# Patient Record
Sex: Female | Born: 1980 | Race: Black or African American | Hispanic: No | Smoking: Former smoker
Health system: Southern US, Community
[De-identification: ages and names within clinical notes are randomized; demographics above are authoritative.]

## PROBLEM LIST (undated history)

## (undated) ENCOUNTER — Inpatient Hospital Stay (HOSPITAL_COMMUNITY): Payer: Self-pay

## (undated) DIAGNOSIS — Z3490 Encounter for supervision of normal pregnancy, unspecified, unspecified trimester: Secondary | ICD-10-CM

## (undated) DIAGNOSIS — Z789 Other specified health status: Secondary | ICD-10-CM

## (undated) HISTORY — PX: DILATION AND CURETTAGE OF UTERUS: SHX78

## (undated) HISTORY — DX: Encounter for supervision of normal pregnancy, unspecified, unspecified trimester: Z34.90

## (undated) HISTORY — DX: Other specified health status: Z78.9

---

## 2002-09-13 ENCOUNTER — Emergency Department (HOSPITAL_COMMUNITY): Admission: EM | Admit: 2002-09-13 | Discharge: 2002-09-14 | Payer: Self-pay | Admitting: *Deleted

## 2003-11-01 ENCOUNTER — Ambulatory Visit (HOSPITAL_COMMUNITY): Admission: RE | Admit: 2003-11-01 | Discharge: 2003-11-01 | Payer: Self-pay | Admitting: Obstetrics & Gynecology

## 2004-04-20 ENCOUNTER — Emergency Department (HOSPITAL_COMMUNITY): Admission: EM | Admit: 2004-04-20 | Discharge: 2004-04-20 | Payer: Self-pay | Admitting: Emergency Medicine

## 2005-10-23 ENCOUNTER — Emergency Department (HOSPITAL_COMMUNITY): Admission: EM | Admit: 2005-10-23 | Discharge: 2005-10-23 | Payer: Self-pay | Admitting: Emergency Medicine

## 2006-04-23 ENCOUNTER — Emergency Department (HOSPITAL_COMMUNITY): Admission: EM | Admit: 2006-04-23 | Discharge: 2006-04-23 | Payer: Self-pay | Admitting: Emergency Medicine

## 2006-10-06 ENCOUNTER — Emergency Department (HOSPITAL_COMMUNITY): Admission: EM | Admit: 2006-10-06 | Discharge: 2006-10-06 | Payer: Self-pay | Admitting: Emergency Medicine

## 2006-12-05 ENCOUNTER — Emergency Department (HOSPITAL_COMMUNITY): Admission: EM | Admit: 2006-12-05 | Discharge: 2006-12-05 | Payer: Self-pay | Admitting: Emergency Medicine

## 2009-06-08 ENCOUNTER — Emergency Department (HOSPITAL_COMMUNITY): Admission: EM | Admit: 2009-06-08 | Discharge: 2009-06-08 | Payer: Self-pay | Admitting: Emergency Medicine

## 2009-11-20 ENCOUNTER — Inpatient Hospital Stay (HOSPITAL_COMMUNITY): Admission: AD | Admit: 2009-11-20 | Discharge: 2009-11-20 | Payer: Self-pay | Admitting: Obstetrics and Gynecology

## 2009-12-03 ENCOUNTER — Inpatient Hospital Stay (HOSPITAL_COMMUNITY): Admission: AD | Admit: 2009-12-03 | Discharge: 2009-12-03 | Payer: Self-pay | Admitting: Family Medicine

## 2009-12-18 ENCOUNTER — Inpatient Hospital Stay (HOSPITAL_COMMUNITY): Admission: AD | Admit: 2009-12-18 | Discharge: 2009-12-21 | Payer: Self-pay | Admitting: Obstetrics & Gynecology

## 2009-12-18 ENCOUNTER — Encounter: Payer: Self-pay | Admitting: Obstetrics & Gynecology

## 2010-05-31 ENCOUNTER — Emergency Department (HOSPITAL_COMMUNITY): Admission: EM | Admit: 2010-05-31 | Discharge: 2010-05-31 | Payer: Self-pay | Admitting: Emergency Medicine

## 2010-06-03 ENCOUNTER — Emergency Department (HOSPITAL_COMMUNITY)
Admission: EM | Admit: 2010-06-03 | Discharge: 2010-06-03 | Payer: Self-pay | Source: Home / Self Care | Admitting: Emergency Medicine

## 2010-07-04 ENCOUNTER — Emergency Department (HOSPITAL_COMMUNITY)
Admission: EM | Admit: 2010-07-04 | Discharge: 2010-07-04 | Payer: Self-pay | Source: Home / Self Care | Admitting: Emergency Medicine

## 2010-08-12 ENCOUNTER — Encounter (HOSPITAL_COMMUNITY): Payer: Self-pay | Admitting: Radiology

## 2010-08-12 ENCOUNTER — Emergency Department (HOSPITAL_COMMUNITY)
Admission: EM | Admit: 2010-08-12 | Discharge: 2010-08-12 | Disposition: A | Discharge status: Home / Self Care | Payer: BC Managed Care – PPO | Attending: Emergency Medicine | Admitting: Emergency Medicine

## 2010-08-12 ENCOUNTER — Emergency Department (HOSPITAL_COMMUNITY): Payer: BC Managed Care – PPO

## 2010-08-12 DIAGNOSIS — R0789 Other chest pain: Secondary | ICD-10-CM | POA: Insufficient documentation

## 2010-08-12 DIAGNOSIS — R319 Hematuria, unspecified: Secondary | ICD-10-CM | POA: Insufficient documentation

## 2010-08-12 DIAGNOSIS — R109 Unspecified abdominal pain: Secondary | ICD-10-CM | POA: Insufficient documentation

## 2010-08-12 LAB — URINALYSIS, ROUTINE W REFLEX MICROSCOPIC
Bilirubin Urine: NEGATIVE
Ketones, ur: NEGATIVE mg/dL
Nitrite: NEGATIVE
Urobilinogen, UA: 0.2 mg/dL (ref 0.0–1.0)
pH: 6.5 (ref 5.0–8.0)

## 2010-08-12 LAB — COMPREHENSIVE METABOLIC PANEL
ALT: 17 U/L (ref 0–35)
Alkaline Phosphatase: 66 U/L (ref 39–117)
BUN: 6 mg/dL (ref 6–23)
CO2: 25 mEq/L (ref 19–32)
Chloride: 105 mEq/L (ref 96–112)
GFR calc non Af Amer: >60 mL/min (ref >60)
Glucose, Bld: 104 mg/dL — ABNORMAL HIGH (ref 70–99)
Potassium: 3.2 mEq/L — ABNORMAL LOW (ref 3.5–5.1)
Sodium: 139 mEq/L (ref 135–145)
Total Bilirubin: 0.4 mg/dL (ref 0.3–1.2)

## 2010-08-12 LAB — DIFFERENTIAL
Basophils Relative: 1 % (ref 0–1)
Eosinophils Absolute: 0.1 10*3/uL (ref 0.0–0.7)
Eosinophils Relative: 2 % (ref 0–5)
Monocytes Relative: 6 % (ref 3–12)
Neutrophils Relative %: 52 % (ref 43–77)

## 2010-08-12 LAB — CBC
MCH: 29.2 pg (ref 26.0–34.0)
MCV: 85.4 fL (ref 78.0–100.0)
Platelets: 352 10*3/uL (ref 150–400)
RBC: 4.24 MIL/uL (ref 3.87–5.11)
RDW: 13.2 % (ref 11.5–15.5)
WBC: 5.6 10*3/uL (ref 4.0–10.5)

## 2010-08-12 LAB — LIPASE, BLOOD: Lipase: 22 U/L (ref 11–59)

## 2010-08-12 LAB — URINE MICROSCOPIC-ADD ON

## 2010-08-12 LAB — POCT PREGNANCY, URINE: Preg Test, Ur: NEGATIVE

## 2010-08-13 LAB — URINE CULTURE: Colony Count: >=100000

## 2010-08-22 ENCOUNTER — Emergency Department (HOSPITAL_COMMUNITY)
Admission: EM | Admit: 2010-08-22 | Discharge: 2010-08-22 | Disposition: A | Discharge status: Home / Self Care | Payer: BC Managed Care – PPO | Attending: Emergency Medicine | Admitting: Emergency Medicine

## 2010-08-22 DIAGNOSIS — R1013 Epigastric pain: Secondary | ICD-10-CM | POA: Insufficient documentation

## 2010-08-22 DIAGNOSIS — F411 Generalized anxiety disorder: Secondary | ICD-10-CM | POA: Insufficient documentation

## 2010-08-22 DIAGNOSIS — R110 Nausea: Secondary | ICD-10-CM | POA: Insufficient documentation

## 2010-08-22 DIAGNOSIS — R634 Abnormal weight loss: Secondary | ICD-10-CM | POA: Insufficient documentation

## 2010-08-22 DIAGNOSIS — R079 Chest pain, unspecified: Secondary | ICD-10-CM | POA: Insufficient documentation

## 2010-08-22 LAB — BASIC METABOLIC PANEL
CO2: 25 mEq/L (ref 19–32)
Chloride: 107 mEq/L (ref 96–112)
Creatinine, Ser: 0.83 mg/dL (ref 0.4–1.2)
GFR calc Af Amer: >60 mL/min (ref >60)
Potassium: 4.1 mEq/L (ref 3.5–5.1)
Sodium: 138 mEq/L (ref 135–145)

## 2010-08-22 LAB — URINALYSIS, ROUTINE W REFLEX MICROSCOPIC
Leukocytes, UA: NEGATIVE
Specific Gravity, Urine: 1.01 (ref 1.005–1.030)
Urine Glucose, Fasting: NEGATIVE mg/dL
Urobilinogen, UA: 0.2 mg/dL (ref 0.0–1.0)

## 2010-08-22 LAB — POCT PREGNANCY, URINE: Preg Test, Ur: NEGATIVE

## 2010-08-22 LAB — URINE MICROSCOPIC-ADD ON

## 2010-09-03 ENCOUNTER — Emergency Department (HOSPITAL_COMMUNITY)
Admission: EM | Admit: 2010-09-03 | Discharge: 2010-09-03 | Disposition: A | Discharge status: Home / Self Care | Payer: BC Managed Care – PPO | Attending: Emergency Medicine | Admitting: Emergency Medicine

## 2010-09-03 ENCOUNTER — Emergency Department (HOSPITAL_COMMUNITY): Payer: BC Managed Care – PPO

## 2010-09-03 DIAGNOSIS — K429 Umbilical hernia without obstruction or gangrene: Secondary | ICD-10-CM | POA: Insufficient documentation

## 2010-09-03 DIAGNOSIS — R1013 Epigastric pain: Secondary | ICD-10-CM | POA: Insufficient documentation

## 2010-09-03 LAB — CBC
HCT: 35.6 % — ABNORMAL LOW (ref 36.0–46.0)
Hemoglobin: 12 g/dL (ref 12.0–15.0)
MCH: 29 pg (ref 26.0–34.0)
MCHC: 33.7 g/dL (ref 30.0–36.0)
MCV: 86 fL (ref 78.0–100.0)

## 2010-09-03 LAB — COMPREHENSIVE METABOLIC PANEL
ALT: 14 U/L (ref 0–35)
BUN: 9 mg/dL (ref 6–23)
CO2: 26 mEq/L (ref 19–32)
Calcium: 9.2 mg/dL (ref 8.4–10.5)
Creatinine, Ser: 0.86 mg/dL (ref 0.4–1.2)
GFR calc non Af Amer: >60 mL/min (ref >60)
Glucose, Bld: 96 mg/dL (ref 70–99)

## 2010-09-03 LAB — LIPASE, BLOOD: Lipase: 25 U/L (ref 11–59)

## 2010-09-03 LAB — DIFFERENTIAL
Lymphocytes Relative: 26 % (ref 12–46)
Monocytes Absolute: 0.3 10*3/uL (ref 0.1–1.0)
Monocytes Relative: 6 % (ref 3–12)
Neutro Abs: 3.3 10*3/uL (ref 1.7–7.7)

## 2010-09-03 LAB — URINALYSIS, ROUTINE W REFLEX MICROSCOPIC
Bilirubin Urine: NEGATIVE
Hgb urine dipstick: NEGATIVE
Ketones, ur: NEGATIVE mg/dL
Specific Gravity, Urine: 1.02 (ref 1.005–1.030)
pH: 6 (ref 5.0–8.0)

## 2010-09-11 ENCOUNTER — Emergency Department (HOSPITAL_COMMUNITY)
Admission: EM | Admit: 2010-09-11 | Discharge: 2010-09-11 | Disposition: A | Discharge status: Home / Self Care | Payer: BC Managed Care – PPO | Attending: Emergency Medicine | Admitting: Emergency Medicine

## 2010-09-11 DIAGNOSIS — R0789 Other chest pain: Secondary | ICD-10-CM | POA: Insufficient documentation

## 2010-09-11 DIAGNOSIS — M79609 Pain in unspecified limb: Secondary | ICD-10-CM | POA: Insufficient documentation

## 2010-09-11 DIAGNOSIS — R51 Headache: Secondary | ICD-10-CM | POA: Insufficient documentation

## 2010-09-11 DIAGNOSIS — Z0000 Encounter for general adult medical examination without abnormal findings: Secondary | ICD-10-CM | POA: Insufficient documentation

## 2010-09-11 DIAGNOSIS — M542 Cervicalgia: Secondary | ICD-10-CM | POA: Insufficient documentation

## 2010-09-11 LAB — BASIC METABOLIC PANEL
CO2: 25 mEq/L (ref 19–32)
Chloride: 105 mEq/L (ref 96–112)
Creatinine, Ser: 0.92 mg/dL (ref 0.4–1.2)
GFR calc Af Amer: >60 mL/min (ref >60)
Sodium: 138 mEq/L (ref 135–145)

## 2010-09-11 LAB — CBC
HCT: 37.2 % (ref 36.0–46.0)
Platelets: 361 10*3/uL (ref 150–400)
RDW: 13.6 % (ref 11.5–15.5)
WBC: 6.2 10*3/uL (ref 4.0–10.5)

## 2010-09-11 LAB — DIFFERENTIAL
Basophils Absolute: 0 10*3/uL (ref 0.0–0.1)
Eosinophils Relative: 1 % (ref 0–5)
Lymphocytes Relative: 37 % (ref 12–46)
Neutro Abs: 3.5 10*3/uL (ref 1.7–7.7)

## 2010-09-11 LAB — POCT CARDIAC MARKERS
CKMB, poc: <1 ng/mL — ABNORMAL LOW (ref 1.0–8.0)
Myoglobin, poc: 31.6 ng/mL (ref 12–200)

## 2010-09-16 LAB — URINALYSIS, ROUTINE W REFLEX MICROSCOPIC
Glucose, UA: NEGATIVE mg/dL
Hgb urine dipstick: NEGATIVE
Specific Gravity, Urine: 1.015 (ref 1.005–1.030)
pH: 8 (ref 5.0–8.0)

## 2010-09-16 LAB — POCT PREGNANCY, URINE: Preg Test, Ur: NEGATIVE

## 2010-09-17 LAB — POCT CARDIAC MARKERS
CKMB, poc: <1 ng/mL — ABNORMAL LOW (ref 1.0–8.0)
Troponin i, poc: <0.05 ng/mL (ref 0.00–0.09)

## 2010-09-17 LAB — CBC
HCT: 40.4 % (ref 36.0–46.0)
MCV: 86 fL (ref 78.0–100.0)
RBC: 4.7 MIL/uL (ref 3.87–5.11)
WBC: 7.2 10*3/uL (ref 4.0–10.5)

## 2010-09-17 LAB — BASIC METABOLIC PANEL
Chloride: 110 mEq/L (ref 96–112)
GFR calc Af Amer: >60 mL/min (ref >60)
Potassium: 3.3 mEq/L — ABNORMAL LOW (ref 3.5–5.1)
Sodium: 139 mEq/L (ref 135–145)

## 2010-09-17 LAB — DIFFERENTIAL
Eosinophils Relative: 1 % (ref 0–5)
Lymphocytes Relative: 20 % (ref 12–46)
Lymphs Abs: 1.4 10*3/uL (ref 0.7–4.0)
Monocytes Absolute: 0.5 10*3/uL (ref 0.1–1.0)
Neutro Abs: 5.3 10*3/uL (ref 1.7–7.7)

## 2010-09-17 LAB — D-DIMER, QUANTITATIVE: D-Dimer, Quant: <0.22 ug/mL-FEU (ref 0.00–0.48)

## 2010-09-21 LAB — CBC
Hemoglobin: 12.7 g/dL (ref 12.0–15.0)
MCHC: 34.6 g/dL (ref 30.0–36.0)
MCV: 92.3 fL (ref 78.0–100.0)
Platelets: 191 10*3/uL (ref 150–400)
RBC: 4.09 MIL/uL (ref 3.87–5.11)
RDW: 14.4 % (ref 11.5–15.5)
RDW: 14.9 % (ref 11.5–15.5)
WBC: 6.3 10*3/uL (ref 4.0–10.5)

## 2010-09-21 LAB — RPR: RPR Ser Ql: NONREACTIVE

## 2010-09-21 LAB — TYPE AND SCREEN
ABO/RH(D): O POS
Antibody Screen: NEGATIVE

## 2010-09-22 LAB — URINALYSIS, ROUTINE W REFLEX MICROSCOPIC
Bilirubin Urine: NEGATIVE
Ketones, ur: NEGATIVE mg/dL
Nitrite: NEGATIVE
Protein, ur: NEGATIVE mg/dL
Urobilinogen, UA: 0.2 mg/dL (ref 0.0–1.0)
pH: 7 (ref 5.0–8.0)

## 2010-09-22 LAB — STREP B DNA PROBE: Strep Group B Ag: NEGATIVE

## 2010-09-22 LAB — GC/CHLAMYDIA PROBE AMP, GENITAL
Chlamydia, DNA Probe: NEGATIVE
GC Probe Amp, Genital: NEGATIVE

## 2010-10-07 LAB — URINE CULTURE

## 2010-10-07 LAB — URINALYSIS, ROUTINE W REFLEX MICROSCOPIC
Glucose, UA: NEGATIVE mg/dL
Leukocytes, UA: NEGATIVE
Protein, ur: NEGATIVE mg/dL
Specific Gravity, Urine: 1.025 (ref 1.005–1.030)

## 2010-10-07 LAB — URINE MICROSCOPIC-ADD ON

## 2010-11-21 NOTE — Op Note (Signed)
Kelli Kennedy, Kelli Kennedy                       ACCOUNT NO.:  [id number]   MEDICAL RECORD NO.:  [id number]                   PATIENT TYPE:  AMB   LOCATION:  DAY                                  FACILITY:  [facility]   PHYSICIAN:  Lazaro Arms, M.D.                DATE OF BIRTH:  1980-07-26   DATE OF PROCEDURE:  11/01/2003  DATE OF DISCHARGE:                                 OPERATIVE REPORT   PREOPERATIVE DIAGNOSIS:  Missed abortion at 10 weeks.   POSTOPERATIVE DIAGNOSIS:  Missed abortion at 10 weeks.   PROCEDURE:  Cervical dilation with a suction and sharp uterine curettage.   SURGEON:  Lazaro Arms, M.D.   ANESTHESIA:  Laryngeal mask airway.   FINDINGS:  The patient comes to the office two days prior and had an embryo  that was not growing for the past three weeks and no fetal cardiac activity.  As a result, she has a missed AB at 10 weeks.   DESCRIPTION OF PROCEDURE:  The patient was taken to the operating room and  placed in the supine position.  She underwent laryngeal mask airway and  placed in the dorsal lithotomy position.  She was prepped and draped in the  usual sterile fashion.   A Graves speculum was placed.  Her cervix was grasped.  A paracervical block  using 0.5% Marcaine with 1:200,000 epinephrine was placed.  The cervix was  dilated serially to allow passage of a 9 French curved curette.  Three  passes were made.  Gentle sharp curettage was performed.  All tissue was  removed.   The patient tolerated the procedure well.  She experienced minimal blood  loss and was taken to the recovery room in good and stable condition.  All  counts were correct.      ___________________________________________                                            Lazaro Arms, M.D.   LHE/MEDQ  D:  11/01/2003  T:  11/01/2003  Job:  [id number]

## 2011-04-23 LAB — URINALYSIS, ROUTINE W REFLEX MICROSCOPIC
Glucose, UA: NEGATIVE
Leukocytes, UA: NEGATIVE
Protein, ur: 30 — AB
Specific Gravity, Urine: >1.03 — ABNORMAL HIGH
Urobilinogen, UA: 1

## 2011-04-23 LAB — URINE MICROSCOPIC-ADD ON

## 2011-11-25 ENCOUNTER — Encounter (HOSPITAL_COMMUNITY): Payer: Self-pay | Admitting: *Deleted

## 2011-11-25 ENCOUNTER — Emergency Department (HOSPITAL_COMMUNITY)
Admission: EM | Admit: 2011-11-25 | Discharge: 2011-11-26 | Disposition: A | Discharge status: Home / Self Care | Payer: Self-pay | Attending: Emergency Medicine | Admitting: Emergency Medicine

## 2011-11-25 DIAGNOSIS — J309 Allergic rhinitis, unspecified: Secondary | ICD-10-CM | POA: Insufficient documentation

## 2011-11-25 DIAGNOSIS — J302 Other seasonal allergic rhinitis: Secondary | ICD-10-CM

## 2011-11-25 MED ORDER — FLUTICASONE PROPIONATE 50 MCG/ACT NA SUSP: 2.0000 | Freq: Every day | NASAL | Status: DC

## 2011-11-25 NOTE — ED Provider Notes (Signed)
History  This chart was scribed for [employer]. Colon Branch, MD by Bennett Scrape. This patient was seen in room [facility] and the patient's care was started at 11:10PM.  CSN: [id number]  Arrival date & time 11/25/11  2238   First MD Initiated Contact with Patient 11/25/11 2310      Chief Complaint  Patient presents with  . Nasal Congestion    The history is provided by the patient. No language interpreter was used.    Kelli Kennedy is a 31 y.o. female who presents to the Emergency Department complaining of 2 weeks of allergy symptoms including sore throat, bilateral temporal HA, congestion and eye irritation. Pt reports onset of sneezing today. She denies any modifying factors. She reports taking zyrtec and benadryl with mild improvement in her symptoms. She reports prior episodes of similar symptoms but states that this year her allergies have been more severe than normal. She denies SOB, cough and wheezing as associated symptoms. She has no h/o chronic medical conditions. She is a former smoker but denies alcohol use. Pt works as a Midwife for [employer].  No PCP.  History reviewed. No pertinent past medical history.  Past Surgical History  Procedure Date  . Cesarean section   . Dilation and curettage of uterus     History reviewed. No pertinent family history.  History  Substance Use Topics  . Smoking status: Former Games developer  . Smokeless tobacco: Not on file  . Alcohol Use: No     Review of Systems  Constitutional: Negative for fever.       10 Systems reviewed and are negative for acute change except as noted in the HPI.  HENT: Positive for sore throat, rhinorrhea, sneezing and postnasal drip. Negative for congestion.   Eyes: Negative for discharge and redness.  Respiratory: Negative for cough and shortness of breath.   Cardiovascular: Negative for chest pain.  Gastrointestinal: Negative for vomiting and abdominal pain.  Musculoskeletal: Negative for back pain.    Skin: Negative for rash.  Neurological: Negative for syncope, numbness and headaches.  Psychiatric/Behavioral:       No behavior change.      Allergies  Review of patient's allergies indicates no known allergies.  Home Medications  No current outpatient prescriptions on file.  Triage Vitals: BP 103/68  Pulse 85  Temp(Src) 98.5 F (36.9 C) (Oral)  Resp 20  Ht 5\' 2"  (1.575 m)  Wt 145 lb (65.772 kg)  BMI 26.52 kg/m2  SpO2 99%  Physical Exam  Nursing note and vitals reviewed. Constitutional: She is oriented to person, place, and time. She appears well-developed and well-nourished. No distress.  HENT:  Head: Normocephalic and atraumatic.       Nasal passages are swollen and erythematous   Eyes:       Allergic conjunctivitis bilaterally that is worse in right eye than the left  Neck: Normal range of motion. Neck supple. No tracheal deviation present.       Shoddy anterior cervical adenopathy bilaterally  Cardiovascular: Normal rate and regular rhythm.  Exam reveals no gallop and no friction rub.   No murmur heard. Pulmonary/Chest: Effort normal and breath sounds normal. No respiratory distress.  Abdominal: Soft. She exhibits no distension.  Musculoskeletal: Normal range of motion. She exhibits no edema.  Neurological: She is alert and oriented to person, place, and time.  Skin: Skin is warm and dry.  Psychiatric: She has a normal mood and affect. Her behavior is normal.    ED  Course  Procedures (including critical care time)  DIAGNOSTIC STUDIES: Oxygen Saturation is 99% on room air, normal by my interpretation.    COORDINATION OF CARE: 11:28PM-Discussed discharge plan of Flonase prescription and advised pt to pick one antihistamine to take for her symptoms. Pt agreed to plan.      MDM  Patient with a history of seasonal allergies who is here with runny nose, allergic conjunctivitis, scratchy throat and hasn't responded to Zyrtec or Benadryl. Given prescription  for Flonase.Pt stable in ED with no significant deterioration in condition.The patient appears reasonably screened and/or stabilized for discharge and I doubt any other medical condition or other [facility] requiring further screening, evaluation, or treatment in the ED at this time prior to discharge.  I personally performed the services described in this documentation, which was scribed in my presence. The recorded information has been reviewed and considered.   MDM Reviewed: nursing note and vitals          Nicoletta Dress. Colon Branch, MD 11/25/11 2349

## 2011-11-25 NOTE — ED Notes (Signed)
Pt reports allergy symptoms for two weeks. States nothing she is taking is working.

## 2011-11-25 NOTE — Discharge Instructions (Signed)
Continue to take your zyrtec. Use the Flonase as directed.    Allergies, Generic Allergies may happen from anything your body is sensitive to. This may be food, medicines, pollens, chemicals, and nearly anything around you in everyday life that produces allergens. An allergen is anything that causes an allergy producing substance. Heredity is often a factor in causing these problems. This means you may have some of the same allergies as your parents. Food allergies happen in all age groups. Food allergies are some of the most severe and life threatening. Some common food allergies are cow's milk, seafood, eggs, nuts, wheat, and soybeans. SYMPTOMS   Swelling around the mouth.   An itchy red rash or hives.   Vomiting or diarrhea.   Difficulty breathing.  SEVERE ALLERGIC REACTIONS ARE LIFE-THREATENING. This reaction is called anaphylaxis. It can cause the mouth and throat to swell and cause difficulty with breathing and swallowing. In severe reactions only a trace amount of food (for example, peanut oil in a salad) may cause death within seconds. Seasonal allergies occur in all age groups. These are seasonal because they usually occur during the same season every year. They may be a reaction to molds, grass pollens, or tree pollens. Other causes of problems are house dust mite allergens, pet dander, and mold spores. The symptoms often consist of nasal congestion, a runny itchy nose associated with sneezing, and tearing itchy eyes. There is often an associated itching of the mouth and ears. The problems happen when you come in contact with pollens and other allergens. Allergens are the particles in the air that the body reacts to with an allergic reaction. This causes you to release allergic antibodies. Through a chain of events, these eventually cause you to release histamine into the blood stream. Although it is meant to be protective to the body, it is this release that causes your discomfort. This  is why you were given anti-histamines to feel better. If you are unable to pinpoint the offending allergen, it may be determined by skin or blood testing. Allergies cannot be cured but can be controlled with medicine. Hay fever is a collection of all or some of the seasonal allergy problems. It may often be treated with simple over-the-counter medicine such as diphenhydramine. Take medicine as directed. Do not drink alcohol or drive while taking this medicine. Check with your caregiver or package insert for child dosages. If these medicines are not effective, there are many new medicines your caregiver can prescribe. Stronger medicine such as nasal spray, eye drops, and corticosteroids may be used if the first things you try do not work well. Other treatments such as immunotherapy or desensitizing injections can be used if all else fails. Follow up with your caregiver if problems continue. These seasonal allergies are usually not life threatening. They are generally more of a nuisance that can often be handled using medicine. HOME CARE INSTRUCTIONS   If unsure what causes a reaction, keep a diary of foods eaten and symptoms that follow. Avoid foods that cause reactions.   If hives or rash are present:   Take medicine as directed.   You may use an over-the-counter antihistamine (diphenhydramine) for hives and itching as needed.   Apply cold compresses (cloths) to the skin or take baths in cool water. Avoid hot baths or showers. Heat will make a rash and itching worse.   If you are severely allergic:   Following a treatment for a severe reaction, hospitalization is often required for closer  follow-up.   Wear a medic-alert bracelet or necklace stating the allergy.   You and your family must learn how to give adrenaline or use an anaphylaxis kit.   If you have had a severe reaction, always carry your anaphylaxis kit or EpiPen with you. Use this medicine as directed by your caregiver if a severe  reaction is occurring. Failure to do so could have a fatal outcome.  SEEK MEDICAL CARE IF:  You suspect a food allergy. Symptoms generally happen within 30 minutes of eating a food.   Your symptoms have not gone away within 2 days or are getting worse.   You develop new symptoms.   You want to retest yourself or your child with a food or drink you think causes an allergic reaction. Never do this if an anaphylactic reaction to that food or drink has happened before. Only do this under the care of a caregiver.  SEEK IMMEDIATE MEDICAL CARE IF:   You have difficulty breathing, are wheezing, or have a tight feeling in your chest or throat.   You have a swollen mouth, or you have hives, swelling, or itching all over your body.   You have had a severe reaction that has responded to your anaphylaxis kit or an EpiPen. These reactions may return when the medicine has worn off. These reactions should be considered life threatening.  MAKE SURE YOU:   Understand these instructions.   Will watch your condition.   Will get help right away if you are not doing well or get worse.  Document Released: 09/15/2002 Document Revised: 06/11/2011 Document Reviewed: 02/20/2008 [facility] Patient Information 2012 [place], [place].

## 2012-05-08 ENCOUNTER — Emergency Department (HOSPITAL_COMMUNITY)
Admission: EM | Admit: 2012-05-08 | Discharge: 2012-05-08 | Disposition: A | Discharge status: Home / Self Care | Payer: Self-pay | Attending: Emergency Medicine | Admitting: Emergency Medicine

## 2012-05-08 ENCOUNTER — Encounter (HOSPITAL_COMMUNITY): Payer: Self-pay | Admitting: *Deleted

## 2012-05-08 DIAGNOSIS — R300 Dysuria: Secondary | ICD-10-CM | POA: Insufficient documentation

## 2012-05-08 DIAGNOSIS — N390 Urinary tract infection, site not specified: Secondary | ICD-10-CM | POA: Insufficient documentation

## 2012-05-08 DIAGNOSIS — R350 Frequency of micturition: Secondary | ICD-10-CM | POA: Insufficient documentation

## 2012-05-08 DIAGNOSIS — M549 Dorsalgia, unspecified: Secondary | ICD-10-CM

## 2012-05-08 DIAGNOSIS — M545 Low back pain, unspecified: Secondary | ICD-10-CM | POA: Insufficient documentation

## 2012-05-08 DIAGNOSIS — Z9889 Other specified postprocedural states: Secondary | ICD-10-CM | POA: Insufficient documentation

## 2012-05-08 DIAGNOSIS — Z87891 Personal history of nicotine dependence: Secondary | ICD-10-CM | POA: Insufficient documentation

## 2012-05-08 LAB — URINALYSIS, ROUTINE W REFLEX MICROSCOPIC
Glucose, UA: NEGATIVE mg/dL
Ketones, ur: NEGATIVE mg/dL
Protein, ur: NEGATIVE mg/dL
Urobilinogen, UA: 0.2 mg/dL (ref 0.0–1.0)

## 2012-05-08 LAB — URINE MICROSCOPIC-ADD ON

## 2012-05-08 MED ORDER — NITROFURANTOIN MONOHYD MACRO 100 MG PO CAPS
100.0000 mg | ORAL_CAPSULE | Freq: Two times a day (BID) | ORAL | Status: DC
  Administered 2012-05-08: 100 mg via ORAL
  Filled 2012-05-08: qty 1

## 2012-05-08 MED ORDER — NITROFURANTOIN MONOHYD MACRO 100 MG PO CAPS: 100.0000 mg | ORAL_CAPSULE | Freq: Two times a day (BID) | ORAL | Status: DC

## 2012-05-08 MED ORDER — HYDROCODONE-ACETAMINOPHEN 5-325 MG PO TABS: ORAL_TABLET | ORAL | Status: DC

## 2012-05-08 MED ORDER — HYDROCODONE-ACETAMINOPHEN 5-325 MG PO TABS
1.0000 | ORAL_TABLET | Freq: Once | ORAL | Status: AC
  Administered 2012-05-08: 1 via ORAL
  Filled 2012-05-08: qty 1

## 2012-05-08 NOTE — ED Provider Notes (Signed)
History     CSN: [id number]  Arrival date & time 05/08/12  1416   None     Chief Complaint  Patient presents with  . Back Pain    (Consider location/radiation/quality/duration/timing/severity/associated sxs/prior treatment) Patient is a 31 y.o. female presenting with back pain. The history is provided by the patient.  Back Pain  This is a new problem. The current episode started more than 1 week ago. The problem occurs constantly. The problem has been gradually worsening. The pain is associated with no known injury. The pain is present in the lumbar spine. The quality of the pain is described as aching. The pain does not radiate. The pain is mild. The symptoms are aggravated by bending, twisting and certain positions. Associated symptoms include dysuria. Pertinent negatives include no chest pain, no fever, no numbness, no abdominal pain, no abdominal swelling, no bowel incontinence, no perianal numbness, no bladder incontinence, no pelvic pain, no leg pain, no paresthesias, no paresis, no tingling and no weakness. She has tried nothing for the symptoms. The treatment provided no relief.    History reviewed. No pertinent past medical history.  Past Surgical History  Procedure Date  . Cesarean section   . Dilation and curettage of uterus     No family history on file.  History  Substance Use Topics  . Smoking status: Former Games developer  . Smokeless tobacco: Not on file  . Alcohol Use: No    OB History    Grav Para Term Preterm Abortions TAB SAB Ect Mult Living                  Review of Systems  Constitutional: Negative for fever.  HENT: Negative for neck pain and neck stiffness.   Respiratory: Negative for shortness of breath.   Cardiovascular: Negative for chest pain.  Gastrointestinal: Negative for vomiting, abdominal pain, constipation and bowel incontinence.  Genitourinary: Positive for dysuria, urgency and frequency. Negative for bladder incontinence, hematuria, flank  pain, decreased urine volume, vaginal bleeding, vaginal discharge, difficulty urinating, vaginal pain and pelvic pain.       No perineal numbness or incontinence of urine or feces  Musculoskeletal: Positive for back pain. Negative for joint swelling.  Skin: Negative for rash.  Neurological: Negative for tingling, weakness, numbness and paresthesias.  All other systems reviewed and are negative.    Allergies  Review of patient's allergies indicates no known allergies.  Home Medications   Current Outpatient Rx  Name  Route  Sig  Dispense  Refill  . FLUTICASONE PROPIONATE 50 MCG/ACT NA SUSP   Nasal   Place 2 sprays into the nose daily.   16 g   2     BP 112/66  Pulse 81  Temp 98.1 F (36.7 C) (Oral)  Resp 20  Ht 5\' 2"  (1.575 m)  Wt 150 lb (68.04 kg)  BMI 27.44 kg/m2  SpO2 100%  Physical Exam  Nursing note and vitals reviewed. Constitutional: She is oriented to person, place, and time. She appears well-developed and well-nourished. No distress.  HENT:  Head: Normocephalic and atraumatic.  Neck: Normal range of motion. Neck supple.  Cardiovascular: Normal rate, regular rhythm and intact distal pulses.   No murmur heard. Pulmonary/Chest: Effort normal and breath sounds normal.  Abdominal: Soft. She exhibits no distension and no mass. There is no tenderness. There is no rebound and no guarding.       No suprapubic tenderness  Musculoskeletal: She exhibits tenderness. She exhibits no edema.  Lumbar back: She exhibits tenderness and pain. She exhibits normal range of motion, no swelling, no deformity, no laceration and normal pulse.       Back:       ttp of the right lumbar paraspinal muscles.  No CVA tenderness  Neurological: She is alert and oriented to person, place, and time. She has normal strength. No cranial nerve deficit or sensory deficit. She exhibits normal muscle tone. Coordination and gait normal.  Reflex Scores:      Patellar reflexes are 2+ on the right  side and 2+ on the left side.      Achilles reflexes are 2+ on the right side and 2+ on the left side. Skin: Skin is warm and dry.    ED Course  Procedures (including critical care time)  Results for orders placed during the hospital encounter of 05/08/12  URINALYSIS, ROUTINE W REFLEX MICROSCOPIC      Component Value Range   Color, Urine YELLOW  YELLOW   APPearance CLOUDY (*) CLEAR   Specific Gravity, Urine >1.030 (*) 1.005 - 1.030   pH 5.5  5.0 - 8.0   Glucose, UA NEGATIVE  NEGATIVE mg/dL   Hgb urine dipstick TRACE (*) NEGATIVE   Bilirubin Urine NEGATIVE  NEGATIVE   Ketones, ur NEGATIVE  NEGATIVE mg/dL   Protein, ur NEGATIVE  NEGATIVE mg/dL   Urobilinogen, UA 0.2  0.0 - 1.0 mg/dL   Nitrite POSITIVE (*) NEGATIVE   Leukocytes, UA NEGATIVE  NEGATIVE  URINE MICROSCOPIC-ADD ON      Component Value Range   Squamous Epithelial / LPF MANY (*) RARE   WBC, UA 3-6  <3 WBC/hpf   RBC / HPF 3-6  <3 RBC/hpf   Bacteria, UA MANY (*) RARE  PREGNANCY, URINE      Component Value Range   Preg Test, Ur NEGATIVE  NEGATIVE     Urine culture is pending    MDM     Pt reported having a sensation of incomplete emptying of her bladder.  Bladder scanned after voiding, with approx 25 to 30 cc of urine remaining.  Sx's likely related to bladder spasms secondary to bacteria.  No fever, vomiting, chills or CVA tenderness to suggest pyleo.  I will start abx and pain medication.  Pt agrees to return here if sx's are not improving     Raffi Milstein L. [place], [place] 05/09/12 2216

## 2012-05-08 NOTE — ED Notes (Signed)
C/o right lower back pain x 1 month; denies injury; reports feeling of incomplete emptying of bladder, and urinary frequency. Denies n/v/d, denies abd pain.

## 2012-05-10 NOTE — ED Provider Notes (Signed)
Medical screening examination/treatment/procedure(s) were performed by non-physician practitioner and as supervising physician I was immediately available for consultation/collaboration.   Glynn Octave, MD 05/10/12 [phone]

## 2012-05-11 LAB — URINE CULTURE: Colony Count: >=100000

## 2012-05-12 NOTE — ED Notes (Signed)
+   Urine] Patient treated with Macrobid-sensitive to same-chart appended per protocol MD. 

## 2012-08-10 LAB — HM PAP SMEAR: HM PAP: NEGATIVE

## 2012-11-10 IMAGING — CR DG ABDOMEN ACUTE W/ 1V CHEST
3 series · 3 of 3 positions shown · non-contrast
Comparison: Chest radiograph 06/03/2010

CLINICAL DATA: Constipation

ACUTE ABDOMEN SERIES (ABDOMEN 2 VIEW & CHEST 1 VIEW)

[w chest pa]
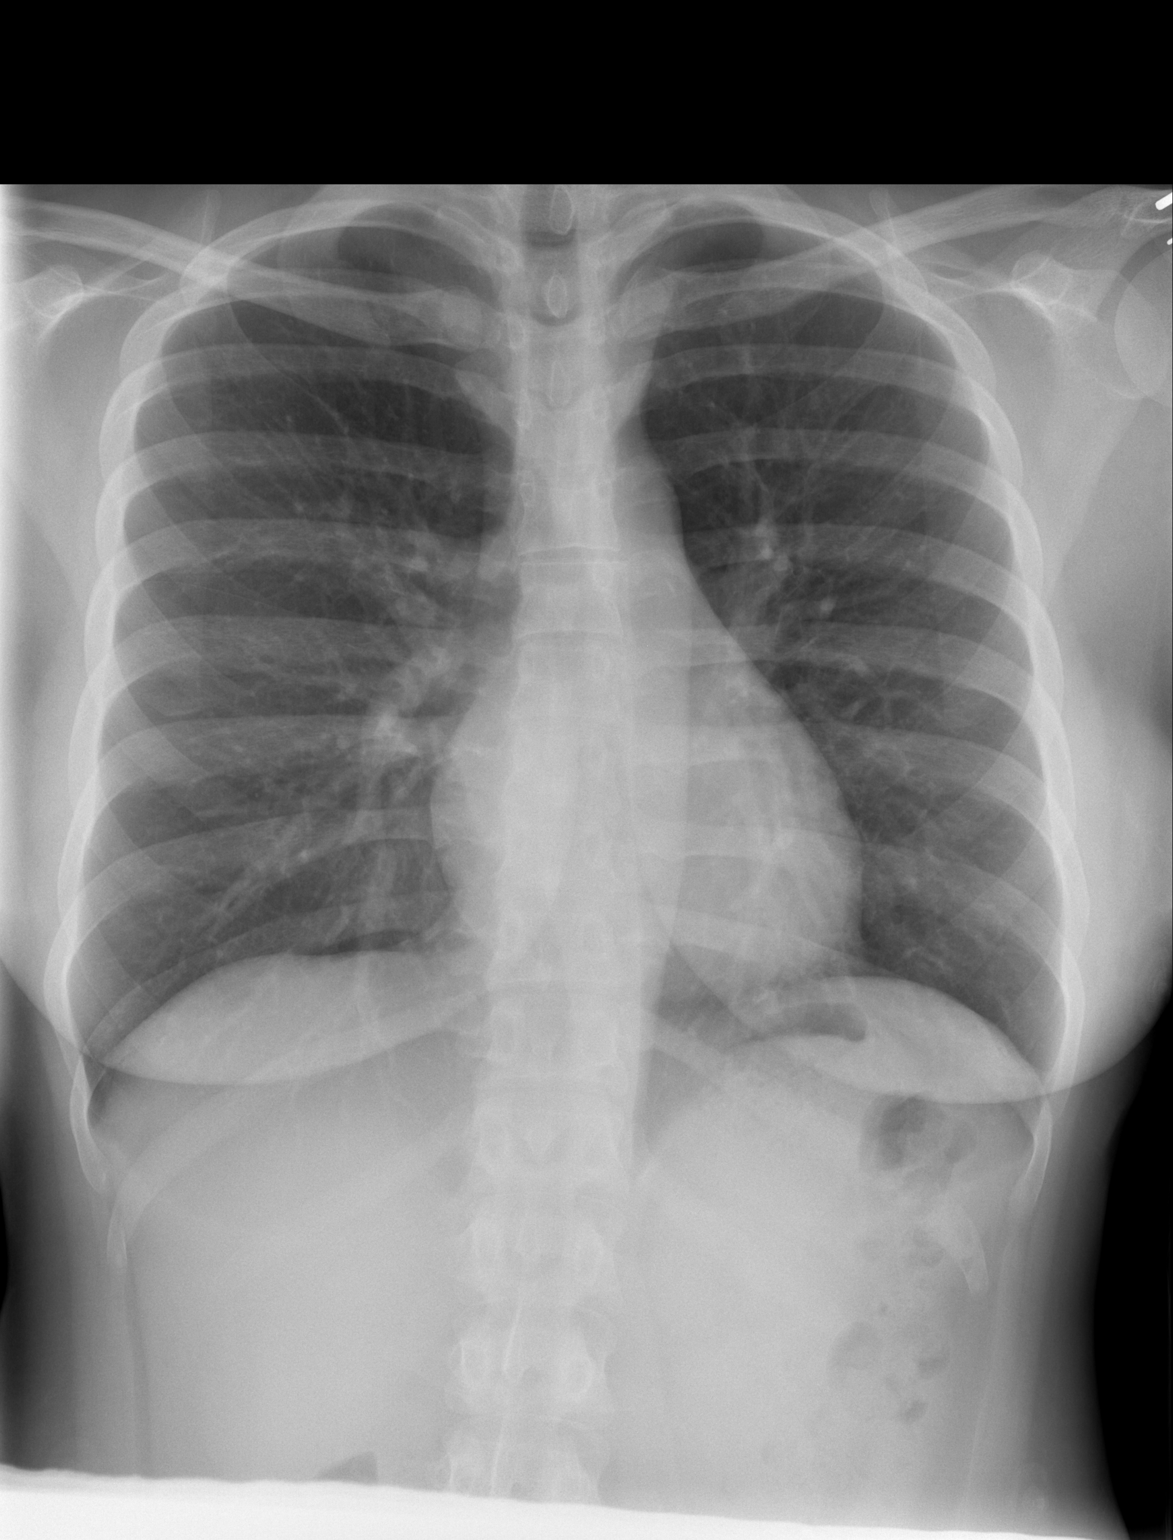

[w abdomen upright]
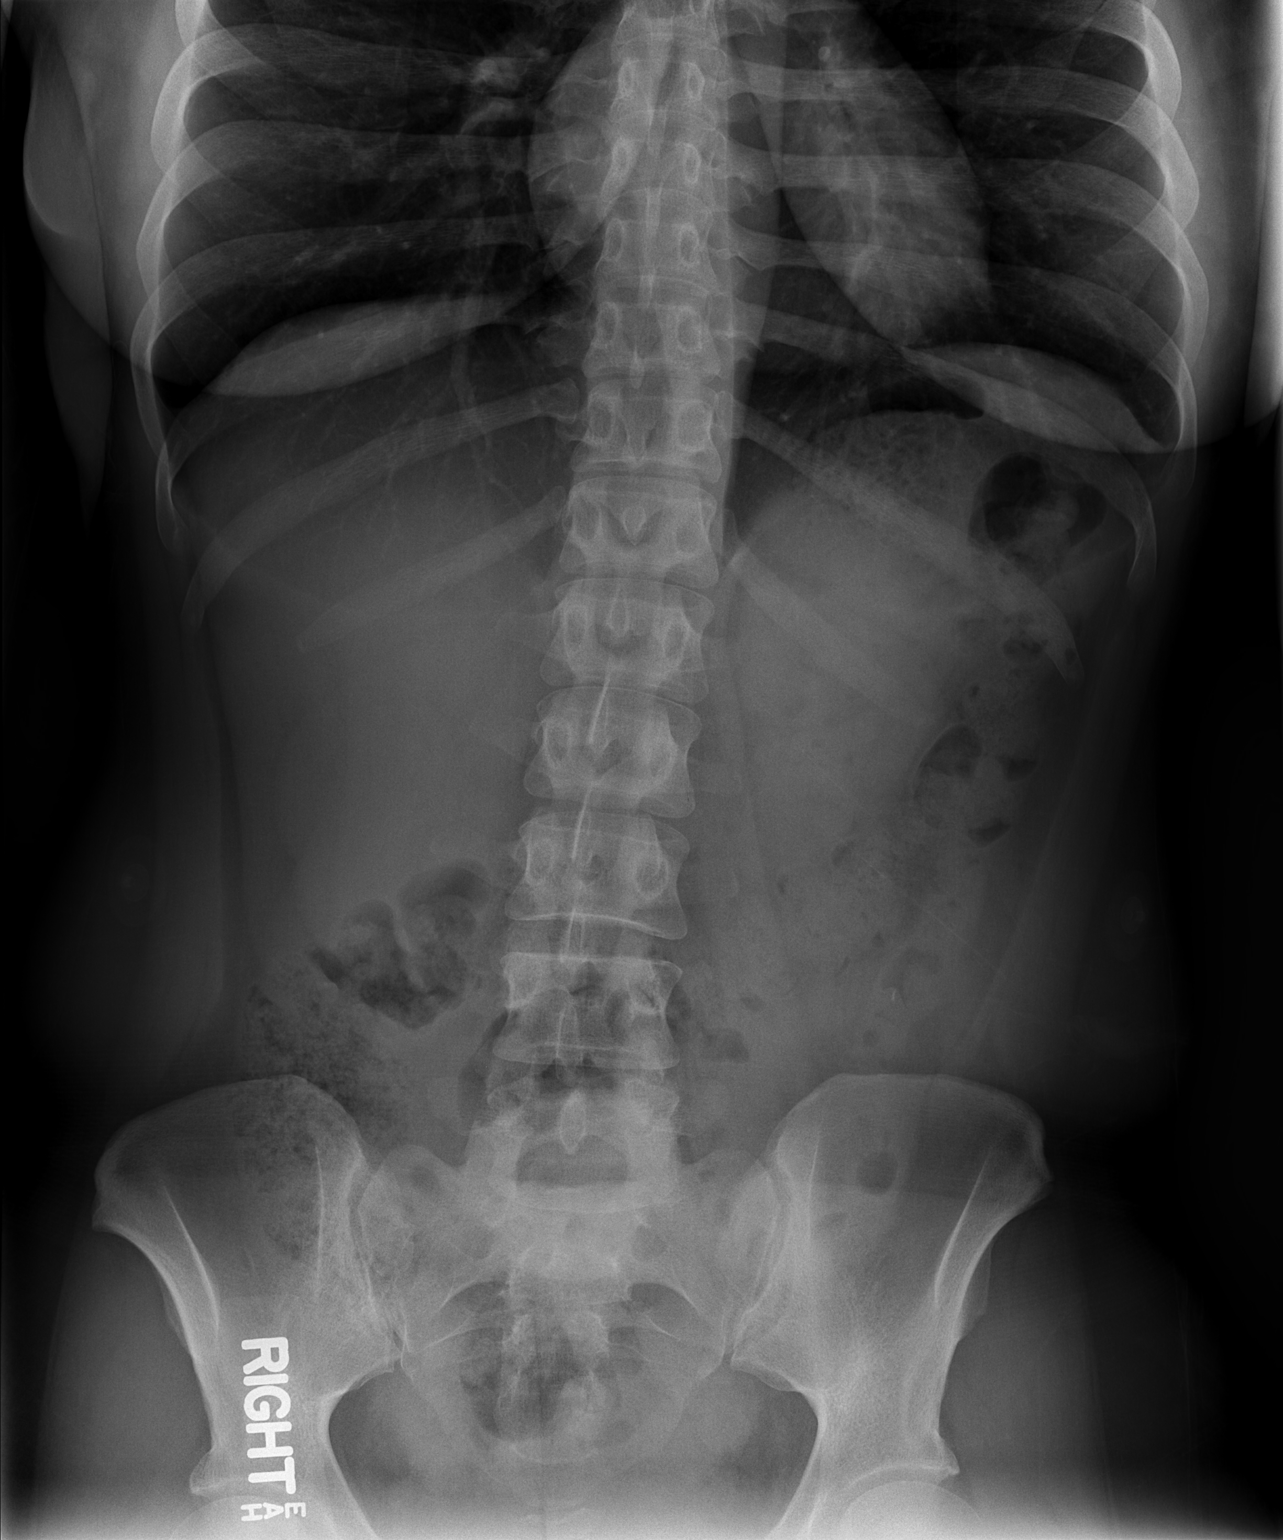

[t abdomen supine]
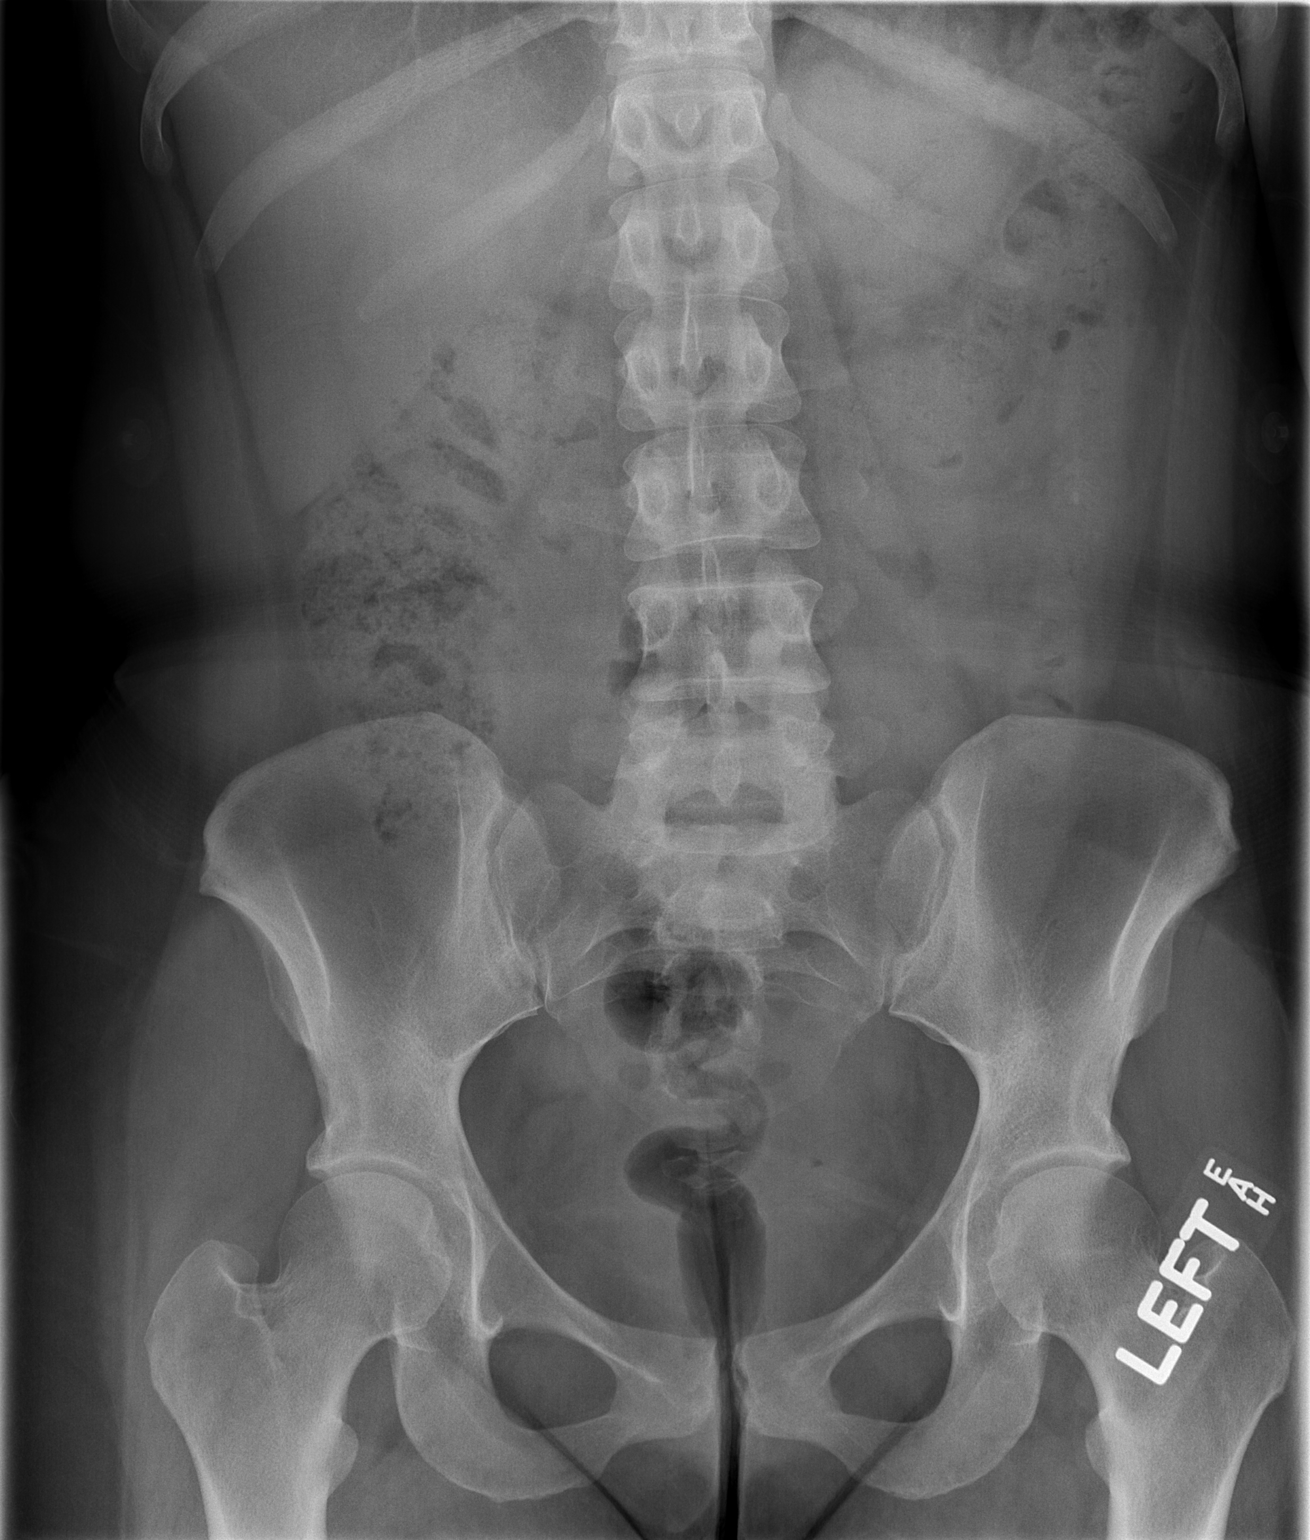

[3 of 3 positions shown; findings below may reference images not displayed]

FINDINGS: Normal heart size, mediastinal contours, and pulmonary vascularity.
Lungs clear.
Normal bowel gas pattern.
No bowel dilatation, bowel wall thickening or free intraperitoneal
air.
Osseous structures unremarkable.
No urinary tract calcification.
Normal stool burden in colon.
IMPRESSION: Normal exam.

## 2013-12-08 ENCOUNTER — Ambulatory Visit: Payer: 59 | Admitting: Obstetrics & Gynecology

## 2014-06-05 HISTORY — PX: INDUCED ABORTION: SHX677

## 2014-07-06 NOTE — L&D Delivery Note (Signed)
Delivery Note Patient presented with SOL, complicated only by GBS positive status which was adequately treated.  At 9:20 AM a viable female was delivered via VBAC, Spontaneous (Presentation: Left Occiput Anterior).  APGAR: 8, 9; weight 6 lb 7.4 oz (2931 g).  Placenta status: Intact, Spontaneous.  Cord: 3 vessels with the following complications: None.  Cord pH: None Collected   Anesthesia: Epidural  Episiotomy: None Lacerations: None Suture Repair: None Est. Blood Loss (mL): 125  Mom to postpartum.  Baby to Couplet care / Skin to Skin.  Kelli Kennedy 06/06/2015, 11:36 AM  OB fellow attestation: Patient is a [id number] at 391w0d who was admitted in SOL  I was gloved and present for delivery in its entirety. Variable decels during second stage noted.  Complications: none  Lacerations: None  EBL: 125  Federico FlakeKimberly Niles [place], MD 5:28 PM

## 2014-10-17 ENCOUNTER — Ambulatory Visit (INDEPENDENT_AMBULATORY_CARE_PROVIDER_SITE_OTHER): Payer: BLUE CROSS/BLUE SHIELD | Admitting: Adult Health

## 2014-10-17 ENCOUNTER — Encounter: Payer: Self-pay | Admitting: Adult Health

## 2014-10-17 VITALS — BP 110/60 | HR 92 | Ht 60.0 in | Wt 168.0 lb

## 2014-10-17 DIAGNOSIS — N926 Irregular menstruation, unspecified: Secondary | ICD-10-CM

## 2014-10-17 DIAGNOSIS — Z3201 Encounter for pregnancy test, result positive: Secondary | ICD-10-CM | POA: Diagnosis not present

## 2014-10-17 DIAGNOSIS — Z3490 Encounter for supervision of normal pregnancy, unspecified, unspecified trimester: Secondary | ICD-10-CM

## 2014-10-17 DIAGNOSIS — O3680X0 Pregnancy with inconclusive fetal viability, not applicable or unspecified: Secondary | ICD-10-CM

## 2014-10-17 HISTORY — DX: Encounter for supervision of normal pregnancy, unspecified, unspecified trimester: Z34.90

## 2014-10-17 LAB — POCT URINE PREGNANCY: Preg Test, Ur: POSITIVE

## 2014-10-17 MED ORDER — DOXYLAMINE-PYRIDOXINE 10-10 MG PO TBEC: DELAYED_RELEASE_TABLET | ORAL | Status: DC

## 2014-10-17 MED ORDER — PRENATAL PLUS 27-1 MG PO TABS: 1.0000 | ORAL_TABLET | Freq: Every day | ORAL | Status: AC

## 2014-10-17 NOTE — Patient Instructions (Addendum)
First Trimester of Pregnancy The first trimester of pregnancy is from week 1 until the end of week 12 (months 1 through 3). A week after a sperm fertilizes an egg, the egg will implant on the wall of the uterus. This embryo will begin to develop into a baby. Genes from you and your partner are forming the baby. The female genes determine whether the baby is a boy or a girl. At 6-8 weeks, the eyes and face are formed, and the heartbeat can be seen on ultrasound. At the end of 12 weeks, all the baby's organs are formed.  Now that you are pregnant, you will want to do everything you can to have a healthy baby. Two of the most important things are to get good prenatal care and to follow your health care provider's instructions. Prenatal care is all the medical care you receive before the baby's birth. This care will help prevent, find, and treat any problems during the pregnancy and childbirth. BODY CHANGES Your body goes through many changes during pregnancy. The changes vary from woman to woman.   You may gain or lose a couple of pounds at first.  You may feel sick to your stomach (nauseous) and throw up (vomit). If the vomiting is uncontrollable, call your health care provider.  You may tire easily.  You may develop headaches that can be relieved by medicines approved by your health care provider.  You may urinate more often. Painful urination may mean you have a bladder infection.  You may develop heartburn as a result of your pregnancy.  You may develop constipation because certain hormones are causing the muscles that push waste through your intestines to slow down.  You may develop hemorrhoids or swollen, bulging veins (varicose veins).  Your breasts may begin to grow larger and become tender. Your nipples may stick out more, and the tissue that surrounds them (areola) may become darker.  Your gums may bleed and may be sensitive to brushing and flossing.  Dark spots or blotches (chloasma,  mask of pregnancy) may develop on your face. This will likely fade after the baby is born.  Your menstrual periods will stop.  You may have a loss of appetite.  You may develop cravings for certain kinds of food.  You may have changes in your emotions from day to day, such as being excited to be pregnant or being concerned that something may go wrong with the pregnancy and baby.  You may have more vivid and strange dreams.  You may have changes in your hair. These can include thickening of your hair, rapid growth, and changes in texture. Some women also have hair loss during or after pregnancy, or hair that feels dry or thin. Your hair will most likely return to normal after your baby is born. WHAT TO EXPECT AT YOUR PRENATAL VISITS During a routine prenatal visit:  You will be weighed to make sure you and the baby are growing normally.  Your blood pressure will be taken.  Your abdomen will be measured to track your baby's growth.  The fetal heartbeat will be listened to starting around week 10 or 12 of your pregnancy.  Test results from any previous visits will be discussed. Your health care provider may ask you:  How you are feeling.  If you are feeling the baby move.  If you have had any abnormal symptoms, such as leaking fluid, bleeding, severe headaches, or abdominal cramping.  If you have any questions. Other tests   that may be performed during your first trimester include:  Blood tests to find your blood type and to check for the presence of any previous infections. They will also be used to check for low iron levels (anemia) and Rh antibodies. Later in the pregnancy, blood tests for diabetes will be done along with other tests if problems develop.  Urine tests to check for infections, diabetes, or protein in the urine.  An ultrasound to confirm the proper growth and development of the baby.  An amniocentesis to check for possible genetic problems.  Fetal screens for  spina bifida and Down syndrome.  You may need other tests to make sure you and the baby are doing well. HOME CARE INSTRUCTIONS  Medicines  Follow your health care provider's instructions regarding medicine use. Specific medicines may be either safe or unsafe to take during pregnancy.  Take your prenatal vitamins as directed.  If you develop constipation, try taking a stool softener if your health care provider approves. Diet  Eat regular, well-balanced meals. Choose a variety of foods, such as meat or vegetable-based protein, fish, milk and low-fat dairy products, vegetables, fruits, and whole grain breads and cereals. Your health care provider will help you determine the amount of weight gain that is right for you.  Avoid raw meat and uncooked cheese. These carry germs that can cause birth defects in the baby.  Eating four or five small meals rather than three large meals a day may help relieve nausea and vomiting. If you start to feel nauseous, eating a few soda crackers can be helpful. Drinking liquids between meals instead of during meals also seems to help nausea and vomiting.  If you develop constipation, eat more high-fiber foods, such as fresh vegetables or fruit and whole grains. Drink enough fluids to keep your urine clear or pale yellow. Activity and Exercise  Exercise only as directed by your health care provider. Exercising will help you:  Control your weight.  Stay in shape.  Be prepared for labor and delivery.  Experiencing pain or cramping in the lower abdomen or low back is a good sign that you should stop exercising. Check with your health care provider before continuing normal exercises.  Try to avoid standing for long periods of time. Move your legs often if you must stand in one place for a long time.  Avoid heavy lifting.  Wear low-heeled shoes, and practice good posture.  You may continue to have sex unless your health care provider directs you  otherwise. Relief of Pain or Discomfort  Wear a good support bra for breast tenderness.   Take warm sitz baths to soothe any pain or discomfort caused by hemorrhoids. Use hemorrhoid cream if your health care provider approves.   Rest with your legs elevated if you have leg cramps or low back pain.  If you develop varicose veins in your legs, wear support hose. Elevate your feet for 15 minutes, 3-4 times a day. Limit salt in your diet. Prenatal Care  Schedule your prenatal visits by the twelfth week of pregnancy. They are usually scheduled monthly at first, then more often in the last 2 months before delivery.  Write down your questions. Take them to your prenatal visits.  Keep all your prenatal visits as directed by your health care provider. Safety  Wear your seat belt at all times when driving.  Make a list of emergency phone numbers, including numbers for family, friends, the hospital, and police and fire departments. General Tips    Ask your health care provider for a referral to a local prenatal education class. Begin classes no later than at the beginning of month 6 of your pregnancy.  Ask for help if you have counseling or nutritional needs during pregnancy. Your health care provider can offer advice or refer you to specialists for help with various needs.  Do not use hot tubs, steam rooms, or saunas.  Do not douche or use tampons or scented sanitary pads.  Do not cross your legs for long periods of time.  Avoid cat litter boxes and soil used by cats. These carry germs that can cause birth defects in the baby and possibly loss of the fetus by miscarriage or stillbirth.  Avoid all smoking, herbs, alcohol, and medicines not prescribed by your health care provider. Chemicals in these affect the formation and growth of the baby.  Schedule a dentist appointment. At home, brush your teeth with a soft toothbrush and be gentle when you floss. SEEK MEDICAL CARE IF:   You have  dizziness.  You have mild pelvic cramps, pelvic pressure, or nagging pain in the abdominal area.  You have persistent nausea, vomiting, or diarrhea.  You have a bad smelling vaginal discharge.  You have pain with urination.  You notice increased swelling in your face, hands, legs, or ankles. SEEK IMMEDIATE MEDICAL CARE IF:   You have a fever.  You are leaking fluid from your vagina.  You have spotting or bleeding from your vagina.  You have severe abdominal cramping or pain.  You have rapid weight gain or loss.  You vomit blood or material that looks like coffee grounds.  You are exposed to [place] measles and have never had them.  You are exposed to fifth disease or chickenpox.  You develop a severe headache.  You have shortness of breath.  You have any kind of trauma, such as from a fall or a car accident. Document Released: 06/16/2001 Document Revised: 11/06/2013 Document Reviewed: 05/02/2013 [facility] Patient Information 2015 [employer] [place]. This information is not intended to replace advice given to you by your health care provider. Make sure you discuss any questions you have with your health care provider. Return in 1 week fior US  Try diclegis Eat often

## 2014-10-17 NOTE — Progress Notes (Signed)
Subjective:     Patient ID: Kelli RiasShameka L Kennedy, female   DOB: Apr 30, 1981, 34 y.o.   MRN: [id number]  HPI Kelli MunsonShameka is a 34 year old black female, married in for missed period and nausea, wants UPT.No bleeding.  Review of Systems +missed period +nausea, all other systems negative Reviewed past medical,surgical, social and family history. Reviewed medications and allergies.     Objective:   Physical Exam BP 110/60 mmHg  Pulse 92  Ht 5' (1.524 m)  Wt 168 lb (76.204 kg)  BMI 32.81 kg/m2  LMP 06/05/2014+UPT, periods irregular not sure of last one, US showed about 5-6 week fetal pole with +YS and FHR 117.Get medicaid, form given.Skin warm and dry. Neck: mid line trachea, normal thyroid, good ROM, no lymphadenopathy noted. Lungs: clear to ausculation bilaterally. Cardiovascular: regular rate and rhythm.Abdomen soft non tender.    Assessment:     Pregnant +UPT    Plan:     Rx prenatal plus #30 1 daily with 11 refills Try diclegis #36 given take as directed Return in 1 week for dating US  Eat often Review handout on first trimester

## 2014-10-21 ENCOUNTER — Encounter (HOSPITAL_COMMUNITY): Payer: Self-pay

## 2014-10-21 ENCOUNTER — Emergency Department (HOSPITAL_COMMUNITY)
Admission: EM | Admit: 2014-10-21 | Discharge: 2014-10-22 | Disposition: A | Discharge status: Home / Self Care | Payer: BLUE CROSS/BLUE SHIELD | Attending: Emergency Medicine | Admitting: Emergency Medicine

## 2014-10-21 DIAGNOSIS — Z3A01 Less than 8 weeks gestation of pregnancy: Secondary | ICD-10-CM | POA: Diagnosis not present

## 2014-10-21 DIAGNOSIS — R109 Unspecified abdominal pain: Secondary | ICD-10-CM

## 2014-10-21 DIAGNOSIS — Z87891 Personal history of nicotine dependence: Secondary | ICD-10-CM | POA: Diagnosis not present

## 2014-10-21 DIAGNOSIS — O9989 Other specified diseases and conditions complicating pregnancy, childbirth and the puerperium: Secondary | ICD-10-CM | POA: Diagnosis not present

## 2014-10-21 DIAGNOSIS — Z9889 Other specified postprocedural states: Secondary | ICD-10-CM | POA: Insufficient documentation

## 2014-10-21 DIAGNOSIS — Z3490 Encounter for supervision of normal pregnancy, unspecified, unspecified trimester: Secondary | ICD-10-CM

## 2014-10-21 DIAGNOSIS — Z79899 Other long term (current) drug therapy: Secondary | ICD-10-CM | POA: Diagnosis not present

## 2014-10-21 NOTE — ED Notes (Signed)
Abdominal cramping that started yesterday, no bleeding, pregnant at this time. Patient states that her LPM was in December but periods have always been irregular. OBGYN appointment scheduled on Thursday of this week.

## 2014-10-22 LAB — URINALYSIS, ROUTINE W REFLEX MICROSCOPIC
BILIRUBIN URINE: NEGATIVE
Glucose, UA: NEGATIVE mg/dL
Hgb urine dipstick: NEGATIVE
KETONES UR: NEGATIVE mg/dL
Leukocytes, UA: NEGATIVE
NITRITE: NEGATIVE
PH: 6.5 (ref 5.0–8.0)
Protein, ur: NEGATIVE mg/dL
Specific Gravity, Urine: 1.01 (ref 1.005–1.030)
Urobilinogen, UA: 0.2 mg/dL (ref 0.0–1.0)

## 2014-10-22 LAB — HCG, QUANTITATIVE, PREGNANCY: HCG, BETA CHAIN, QUANT, S: 61971 m[IU]/mL — AB (ref <5)

## 2014-10-22 NOTE — ED Provider Notes (Signed)
CSN: [id number]     Arrival date & time 10/21/14  2325 History   First MD Initiated Contact with Patient 10/21/14 2331     Chief Complaint  Patient presents with  . Abdominal Cramping     (Consider location/radiation/quality/duration/timing/severity/associated sxs/prior Treatment) HPI Comments: Patient is a 34 year old female with a history of 3 pregnancies and 2 deliveries. She states that at this time she has about [redacted] weeks pregnant. She presents to the emergency department due to a complaint of abdominal cramping.  The patient states that on yesterday she started noticing some cramping in the lower abdomen. Today the cramping was not as severe but still present. She became concerned and came to the emergency department for evaluation. There's been no vaginal bleeding, no high fever, no injury to the abdomen or pelvis. His been no vomiting. There has been some mild nausea. It is of note that the patient was seen by her GYN physician last week at which time the 5 week pregnancy was determined by ultrasound. Fetal heart beat was identified.  Patient is a 34 y.o. female presenting with cramps. The history is provided by the patient.  Abdominal Cramping This is a new problem. Associated symptoms include nausea.    Past Medical History  Diagnosis Date  . Pregnant 10/17/2014   Past Surgical History  Procedure Laterality Date  . Cesarean section    . Dilation and curettage of uterus    . Induced abortion  06/2014   No family history on file. History  Substance Use Topics  . Smoking status: Former Smoker    Types: Cigarettes  . Smokeless tobacco: Never Used  . Alcohol Use: No   OB History    Gravida Para Term Preterm AB TAB SAB Ectopic Multiple Living   Review of Systems  Gastrointestinal: Positive for nausea.  All other systems reviewed and are negative.     Allergies  Review of patient's allergies indicates no known allergies.  Home Medications   Prior  to Admission medications   Medication Sig Start Date End Date Taking? Authorizing Provider  Doxylamine-Pyridoxine (DICLEGIS) 10-10 MG TBEC Take as directed 10/17/14  Yes [name], NP  prenatal vitamin w/FE, FA (PRENATAL 1 + 1) 27-1 MG TABS tablet Take 1 tablet by mouth daily at 12 noon. 10/17/14  Yes [name], NP   BP 120/77 mmHg  Pulse 96  Temp(Src) 98.6 F (37 C) (Oral)  Resp 20  Ht 5' (1.524 m)  Wt 168 lb (76.204 kg)  BMI 32.81 kg/m2  SpO2 100%  LMP 06/05/2014 Physical Exam  Constitutional: She is oriented to person, place, and time. She appears well-developed and well-nourished.  Non-toxic appearance.  HENT:  Head: Normocephalic.  Right Ear: Tympanic membrane and external ear normal.  Left Ear: Tympanic membrane and external ear normal.  Eyes: EOM and lids are normal. Pupils are equal, round, and reactive to light.  Neck: Normal range of motion. Neck supple. Carotid bruit is not present.  Cardiovascular: Normal rate, regular rhythm, normal heart sounds, intact distal pulses and normal pulses.   Pulmonary/Chest: Breath sounds normal. No respiratory distress.  Abdominal: Soft. Bowel sounds are normal. She exhibits no distension and no mass. There is no tenderness. There is no guarding.  Musculoskeletal: Normal range of motion.  Lymphadenopathy:       Head (right side): No submandibular adenopathy present.       Head (left  side): No submandibular adenopathy present.    She has no cervical adenopathy.  Neurological: She is alert and oriented to person, place, and time. She has normal strength. No cranial nerve deficit or sensory deficit.  Skin: Skin is warm and dry.  Psychiatric: She has a normal mood and affect. Her speech is normal.  Nursing note and vitals reviewed.   ED Course  Procedures (including critical care time) Labs Review Labs Reviewed  URINALYSIS, ROUTINE W REFLEX MICROSCOPIC  HCG, QUANTITATIVE, PREGNANCY    Imaging Review No results  found.   EKG Interpretation None      MDM  Vital signs are well within normal limits. The patient is an ambulatory without problem. Urine analysis was obtained and found to be within normal limits. The patient's ultrasound report from records at Dr. Rayna SextonFerguson's office were reviewed. No acute abnormality was identified on the ultrasound at that time.  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  I have encouraged patient to keep her appointment with her GYN next week. Discussed the results of the exam and the ultrasound, and the lab findings with the patient in terms which he understands. The patient agrees to see the primary GYN physician, or [facility], or return here to the emergency department if any changes, problems, or concerns.    Final diagnoses:  Abdominal cramping  Pregnancy    *I have reviewed nursing notes, vital signs, and all appropriate lab and imaging results for this patient.[address]    Verlia Kaney, PA-C 10/22/14 0127  Zadie Rhineonald Wickline, MD 10/22/14 [phone]

## 2014-10-22 NOTE — ED Provider Notes (Signed)
Patient seen/examined in the Emergency Department in conjunction with Midlevel Provider ChaseBryant Patient reports abd cramping and known pregnancy Exam : awake/alert, abd soft without focal tenderness Plan: pt just had OB US performed this past week (see EPIC for details) She is well appearing Urinalysis ordered She can f/u as outpatient with OBGYN   Zadie Rhineonald Robyn Galati, MD 10/22/14 [phone]

## 2014-10-22 NOTE — Discharge Instructions (Signed)
Your vital signs are well within normal limits. I reviewed the ultrasound done at your GYN last week. No acute or emergent findings at that time. Your urinalysis is well within normal limits. Please see Dr. Emelda Fear as previously arranged. Please see the physicians at the [facility] First Trimester of Pregnancy The first trimester of pregnancy is from week 1 until the end of week 12 (months 1 through 3). A week after a sperm fertilizes an egg, the egg will implant on the wall of the uterus. This embryo will begin to develop into a baby. Genes from you and your partner are forming the baby. The female genes determine whether the baby is a boy or a girl. At 6-8 weeks, the eyes and face are formed, and the heartbeat can be seen on ultrasound. At the end of 12 weeks, all the baby's organs are formed.  Now that you are pregnant, you will want to do everything you can to have a healthy baby. Two of the most important things are to get good prenatal care and to follow your health care provider's instructions. Prenatal care is all the medical care you receive before the baby's birth. This care will help prevent, find, and treat any problems during the pregnancy and childbirth. BODY CHANGES Your body goes through many changes during pregnancy. The changes vary from woman to woman.   You may gain or lose a couple of pounds at first.  You may feel sick to your stomach (nauseous) and throw up (vomit). If the vomiting is uncontrollable, call your health care provider.  You may tire easily.  You may develop headaches that can be relieved by medicines approved by your health care provider.  You may urinate more often. Painful urination may mean you have a bladder infection.  You may develop heartburn as a result of your pregnancy.  You may develop constipation because certain hormones are causing the muscles that push waste through your intestines to slow down.  You may develop hemorrhoids or swollen, bulging veins  (varicose veins).  Your breasts may begin to grow larger and become tender. Your nipples may stick out more, and the tissue that surrounds them (areola) may become darker.  Your gums may bleed and may be sensitive to brushing and flossing.  Dark spots or blotches (chloasma, mask of pregnancy) may develop on your face. This will likely fade after the baby is born.  Your menstrual periods will stop.  You may have a loss of appetite.  You may develop cravings for certain kinds of food.  You may have changes in your emotions from day to day, such as being excited to be pregnant or being concerned that something may go wrong with the pregnancy and baby.  You may have more vivid and strange dreams.  You may have changes in your hair. These can include thickening of your hair, rapid growth, and changes in texture. Some women also have hair loss during or after pregnancy, or hair that feels dry or thin. Your hair will most likely return to normal after your baby is born. WHAT TO EXPECT AT YOUR PRENATAL VISITS During a routine prenatal visit:  You will be weighed to make sure you and the baby are growing normally.  Your blood pressure will be taken.  Your abdomen will be measured to track your baby's growth.  The fetal heartbeat will be listened to starting around week 10 or 12 of your pregnancy.  Test results from any previous visits will be  discussed. Your health care provider may ask you:  How you are feeling.  If you are feeling the baby move.  If you have had any abnormal symptoms, such as leaking fluid, bleeding, severe headaches, or abdominal cramping.  If you have any questions. Other tests that may be performed during your first trimester include:  Blood tests to find your blood type and to check for the presence of any previous infections. They will also be used to check for low iron levels (anemia) and Rh antibodies. Later in the pregnancy, blood tests for diabetes will be  done along with other tests if problems develop.  Urine tests to check for infections, diabetes, or protein in the urine.  An ultrasound to confirm the proper growth and development of the baby.  An amniocentesis to check for possible genetic problems.  Fetal screens for spina bifida and Down syndrome.  You may need other tests to make sure you and the baby are doing well. HOME CARE INSTRUCTIONS  Medicines  Follow your health care provider's instructions regarding medicine use. Specific medicines may be either safe or unsafe to take during pregnancy.  Take your prenatal vitamins as directed.  If you develop constipation, try taking a stool softener if your health care provider approves. Diet  Eat regular, well-balanced meals. Choose a variety of foods, such as meat or vegetable-based protein, fish, milk and low-fat dairy products, vegetables, fruits, and whole grain breads and cereals. Your health care provider will help you determine the amount of weight gain that is right for you.  Avoid raw meat and uncooked cheese. These carry germs that can cause birth defects in the baby.  Eating four or five small meals rather than three large meals a day may help relieve nausea and vomiting. If you start to feel nauseous, eating a few soda crackers can be helpful. Drinking liquids between meals instead of during meals also seems to help nausea and vomiting.  If you develop constipation, eat more high-fiber foods, such as fresh vegetables or fruit and whole grains. Drink enough fluids to keep your urine clear or pale yellow. Activity and Exercise  Exercise only as directed by your health care provider. Exercising will help you:  Control your weight.  Stay in shape.  Be prepared for labor and delivery.  Experiencing pain or cramping in the lower abdomen or low back is a good sign that you should stop exercising. Check with your health care provider before continuing normal  exercises.  Try to avoid standing for long periods of time. Move your legs often if you must stand in one place for a long time.  Avoid heavy lifting.  Wear low-heeled shoes, and practice good posture.  You may continue to have sex unless your health care provider directs you otherwise. Relief of Pain or Discomfort  Wear a good support bra for breast tenderness.   Take warm sitz baths to soothe any pain or discomfort caused by hemorrhoids. Use hemorrhoid cream if your health care provider approves.   Rest with your legs elevated if you have leg cramps or low back pain.  If you develop varicose veins in your legs, wear support hose. Elevate your feet for 15 minutes, 3-4 times a day. Limit salt in your diet. Prenatal Care  Schedule your prenatal visits by the twelfth week of pregnancy. They are usually scheduled monthly at first, then more often in the last 2 months before delivery.  Write down your questions. Take them to your prenatal  visits.  Keep all your prenatal visits as directed by your health care provider. Safety  Wear your seat belt at all times when driving.  Make a list of emergency phone numbers, including numbers for family, friends, the hospital, and police and fire departments. General Tips  Ask your health care provider for a referral to a local prenatal education class. Begin classes no later than at the beginning of month 6 of your pregnancy.  Ask for help if you have counseling or nutritional needs during pregnancy. Your health care provider can offer advice or refer you to specialists for help with various needs.  Do not use hot tubs, steam rooms, or saunas.  Do not douche or use tampons or scented sanitary pads.  Do not cross your legs for long periods of time.  Avoid cat litter boxes and soil used by cats. These carry germs that can cause birth defects in the baby and possibly loss of the fetus by miscarriage or stillbirth.  Avoid all smoking,  herbs, alcohol, and medicines not prescribed by your health care provider. Chemicals in these affect the formation and growth of the baby.  Schedule a dentist appointment. At home, brush your teeth with a soft toothbrush and be gentle when you floss. SEEK MEDICAL CARE IF:   You have dizziness.  You have mild pelvic cramps, pelvic pressure, or nagging pain in the abdominal area.  You have persistent nausea, vomiting, or diarrhea.  You have a bad smelling vaginal discharge.  You have pain with urination.  You notice increased swelling in your face, hands, legs, or ankles. SEEK IMMEDIATE MEDICAL CARE IF:   You have a fever.  You are leaking fluid from your vagina.  You have spotting or bleeding from your vagina.  You have severe abdominal cramping or pain.  You have rapid weight gain or loss.  You vomit blood or material that looks like coffee grounds.  You are exposed to [place] measles and have never had them.  You are exposed to fifth disease or chickenpox.  You develop a severe headache.  You have shortness of breath.  You have any kind of trauma, such as from a fall or a car accident. Document Released: 06/16/2001 Document Revised: 11/06/2013 Document Reviewed: 05/02/2013 [facility] Patient Information 2015 [place], [place]. This information is not intended to replace advice given to you by your health care provider. Make sure you discuss any questions you have with your health care provider. [facility] or return to this facility if any changes or emergent problems.

## 2014-10-25 ENCOUNTER — Ambulatory Visit (INDEPENDENT_AMBULATORY_CARE_PROVIDER_SITE_OTHER): Payer: BLUE CROSS/BLUE SHIELD

## 2014-10-25 DIAGNOSIS — O3680X0 Pregnancy with inconclusive fetal viability, not applicable or unspecified: Secondary | ICD-10-CM

## 2014-10-25 NOTE — Progress Notes (Signed)
US  7w IUP ,crl 1.07, pos fht 149bpm,normal ov's bilat,EDD 06/13/2015

## 2014-11-06 ENCOUNTER — Encounter: Payer: Self-pay | Admitting: Women's Health

## 2014-11-06 ENCOUNTER — Ambulatory Visit (INDEPENDENT_AMBULATORY_CARE_PROVIDER_SITE_OTHER): Payer: BLUE CROSS/BLUE SHIELD | Admitting: Women's Health

## 2014-11-06 VITALS — BP 100/58 | HR 92 | Wt 167.0 lb

## 2014-11-06 DIAGNOSIS — Z3682 Encounter for antenatal screening for nuchal translucency: Secondary | ICD-10-CM

## 2014-11-06 DIAGNOSIS — Z369 Encounter for antenatal screening, unspecified: Secondary | ICD-10-CM

## 2014-11-06 DIAGNOSIS — Z331 Pregnant state, incidental: Secondary | ICD-10-CM

## 2014-11-06 DIAGNOSIS — Z0283 Encounter for blood-alcohol and blood-drug test: Secondary | ICD-10-CM

## 2014-11-06 DIAGNOSIS — Z3491 Encounter for supervision of normal pregnancy, unspecified, first trimester: Secondary | ICD-10-CM

## 2014-11-06 DIAGNOSIS — O34219 Maternal care for unspecified type scar from previous cesarean delivery: Secondary | ICD-10-CM

## 2014-11-06 DIAGNOSIS — Z1389 Encounter for screening for other disorder: Secondary | ICD-10-CM

## 2014-11-06 NOTE — Patient Instructions (Signed)
For Headaches:   Stay well hydrated, drink enough water so that your urine is clear, sometimes if you are dehydrated you can get headaches  Eat small frequent meals and snacks, sometimes if you are hungry you can get headaches  Sometimes you get headaches during pregnancy from the pregnancy hormones  You can try tylenol (1-2 regular strength 325mg or 1-2 extra strength 500mg) as directed on the box. The least amount of medication that works is best.   Cool compresses (cool wet washcloth or ice pack) to area of head that is hurting  You can also try drinking a caffeinated drink to see if this will help  Call us if these things aren't helping your headaches   Nausea & Vomiting  Have saltine crackers or pretzels by your bed and eat a few bites before you raise your head out of bed in the morning  Eat small frequent meals throughout the day instead of large meals  Drink plenty of fluids throughout the day to stay hydrated, just don't drink a lot of fluids with your meals.  This can make your stomach fill up faster making you feel sick  Do not brush your teeth right after you eat  Products with real ginger are good for nausea, like ginger ale and ginger hard candy Make sure it says made with real ginger!  Sucking on sour candy like lemon heads is also good for nausea  If your prenatal vitamins make you nauseated, take them at night so you will sleep through the nausea  Sea Bands  If you feel like you need medicine for the nausea & vomiting please let us know  If you are unable to keep any fluids or food down please let us know   Constipation  Drink plenty of fluid, preferably water, throughout the day  Eat foods high in fiber such as fruits, vegetables, and grains  Exercise, such as walking, is a good way to keep your bowels regular  Drink warm fluids, especially warm prune juice, or decaf coffee  Eat a 1/2 cup of real oatmeal (not instant), 1/2 cup applesauce, and 1/2-1  cup warm prune juice every day  If needed, you may take Colace (docusate sodium) stool softener once or twice a day to help keep the stool soft. If you are pregnant, wait until you are out of your first trimester (12-14 weeks of pregnancy)  If you still are having problems with constipation, you may take Miralax once daily as needed to help keep your bowels regular.  If you are pregnant, wait until you are out of your first trimester (12-14 weeks of pregnancy)   First Trimester of Pregnancy The first trimester of pregnancy is from week 1 until the end of week 12 (months 1 through 3). A week after a sperm fertilizes an egg, the egg will implant on the wall of the uterus. This embryo will begin to develop into a baby. Genes from you and your partner are forming the baby. The female genes determine whether the baby is a boy or a girl. At 6-8 weeks, the eyes and face are formed, and the heartbeat can be seen on ultrasound. At the end of 12 weeks, all the baby's organs are formed.  Now that you are pregnant, you will want to do everything you can to have a healthy baby. Two of the most important things are to get good prenatal care and to follow your health care provider's instructions. Prenatal care is all the   medical care you receive before the baby's birth. This care will help prevent, find, and treat any problems during the pregnancy and childbirth. BODY CHANGES Your body goes through many changes during pregnancy. The changes vary from woman to woman.   You may gain or lose a couple of pounds at first.  You may feel sick to your stomach (nauseous) and throw up (vomit). If the vomiting is uncontrollable, call your health care provider.  You may tire easily.  You may develop headaches that can be relieved by medicines approved by your health care provider.  You may urinate more often. Painful urination may mean you have a bladder infection.  You may develop heartburn as a result of your  pregnancy.  You may develop constipation because certain hormones are causing the muscles that push waste through your intestines to slow down.  You may develop hemorrhoids or swollen, bulging veins (varicose veins).  Your breasts may begin to grow larger and become tender. Your nipples may stick out more, and the tissue that surrounds them (areola) may become darker.  Your gums may bleed and may be sensitive to brushing and flossing.  Dark spots or blotches (chloasma, mask of pregnancy) may develop on your face. This will likely fade after the baby is born.  Your menstrual periods will stop.  You may have a loss of appetite.  You may develop cravings for certain kinds of food.  You may have changes in your emotions from day to day, such as being excited to be pregnant or being concerned that something may go wrong with the pregnancy and baby.  You may have more vivid and strange dreams.  You may have changes in your hair. These can include thickening of your hair, rapid growth, and changes in texture. Some women also have hair loss during or after pregnancy, or hair that feels dry or thin. Your hair will most likely return to normal after your baby is born. WHAT TO EXPECT AT YOUR PRENATAL VISITS During a routine prenatal visit: 8. You will be weighed to make sure you and the baby are growing normally. 9. Your blood pressure will be taken. 10. Your abdomen will be measured to track your baby's growth. 11. The fetal heartbeat will be listened to starting around week 10 or 12 of your pregnancy. 12. Test results from any previous visits will be discussed. Your health care provider may ask you:  How you are feeling.  If you are feeling the baby move.  If you have had any abnormal symptoms, such as leaking fluid, bleeding, severe headaches, or abdominal cramping.  If you have any questions. Other tests that may be performed during your first trimester include:  Blood tests to find  your blood type and to check for the presence of any previous infections. They will also be used to check for low iron levels (anemia) and Rh antibodies. Later in the pregnancy, blood tests for diabetes will be done along with other tests if problems develop.  Urine tests to check for infections, diabetes, or protein in the urine.  An ultrasound to confirm the proper growth and development of the baby.  An amniocentesis to check for possible genetic problems.  Fetal screens for spina bifida and Down syndrome.  You may need other tests to make sure you and the baby are doing well. HOME CARE INSTRUCTIONS  Medicines  Follow your health care provider's instructions regarding medicine use. Specific medicines may be either safe or unsafe to take   during pregnancy.  Take your prenatal vitamins as directed.  If you develop constipation, try taking a stool softener if your health care provider approves. Diet  Eat regular, well-balanced meals. Choose a variety of foods, such as meat or vegetable-based protein, fish, milk and low-fat dairy products, vegetables, fruits, and whole grain breads and cereals. Your health care provider will help you determine the amount of weight gain that is right for you.  Avoid raw meat and uncooked cheese. These carry germs that can cause birth defects in the baby.  Eating four or five small meals rather than three large meals a day may help relieve nausea and vomiting. If you start to feel nauseous, eating a few soda crackers can be helpful. Drinking liquids between meals instead of during meals also seems to help nausea and vomiting.  If you develop constipation, eat more high-fiber foods, such as fresh vegetables or fruit and whole grains. Drink enough fluids to keep your urine clear or pale yellow. Activity and Exercise  Exercise only as directed by your health care provider. Exercising will help you:  Control your weight.  Stay in shape.  Be prepared for  labor and delivery.  Experiencing pain or cramping in the lower abdomen or low back is a good sign that you should stop exercising. Check with your health care provider before continuing normal exercises.  Try to avoid standing for long periods of time. Move your legs often if you must stand in one place for a long time.  Avoid heavy lifting.  Wear low-heeled shoes, and practice good posture.  You may continue to have sex unless your health care provider directs you otherwise. Relief of Pain or Discomfort  Wear a good support bra for breast tenderness.   Take warm sitz baths to soothe any pain or discomfort caused by hemorrhoids. Use hemorrhoid cream if your health care provider approves.   Rest with your legs elevated if you have leg cramps or low back pain.  If you develop varicose veins in your legs, wear support hose. Elevate your feet for 15 minutes, 3-4 times a day. Limit salt in your diet. Prenatal Care  Schedule your prenatal visits by the twelfth week of pregnancy. They are usually scheduled monthly at first, then more often in the last 2 months before delivery.  Write down your questions. Take them to your prenatal visits.  Keep all your prenatal visits as directed by your health care provider. Safety  Wear your seat belt at all times when driving.  Make a list of emergency phone numbers, including numbers for family, friends, the hospital, and police and fire departments. General Tips  Ask your health care provider for a referral to a local prenatal education class. Begin classes no later than at the beginning of month 6 of your pregnancy.  Ask for help if you have counseling or nutritional needs during pregnancy. Your health care provider can offer advice or refer you to specialists for help with various needs.  Do not use hot tubs, steam rooms, or saunas.  Do not douche or use tampons or scented sanitary pads.  Do not cross your legs for long periods of  time.  Avoid cat litter boxes and soil used by cats. These carry germs that can cause birth defects in the baby and possibly loss of the fetus by miscarriage or stillbirth.  Avoid all smoking, herbs, alcohol, and medicines not prescribed by your health care provider. Chemicals in these affect the formation and growth   of the baby.  Schedule a dentist appointment. At home, brush your teeth with a soft toothbrush and be gentle when you floss. SEEK MEDICAL CARE IF:   You have dizziness.  You have mild pelvic cramps, pelvic pressure, or nagging pain in the abdominal area.  You have persistent nausea, vomiting, or diarrhea.  You have a bad smelling vaginal discharge.  You have pain with urination.  You notice increased swelling in your face, hands, legs, or ankles. SEEK IMMEDIATE MEDICAL CARE IF:   You have a fever.  You are leaking fluid from your vagina.  You have spotting or bleeding from your vagina.  You have severe abdominal cramping or pain.  You have rapid weight gain or loss.  You vomit blood or material that looks like coffee grounds.  You are exposed to [place] measles and have never had them.  You are exposed to fifth disease or chickenpox.  You develop a severe headache.  You have shortness of breath.  You have any kind of trauma, such as from a fall or a car accident. Document Released: 06/16/2001 Document Revised: 11/06/2013 Document Reviewed: 05/02/2013 Exit[employer] Patient Information 2015 [employer]. This information is not intended to replace advice given to you by your health care provider. Make sure you discuss any questions you have with your health care provider.   

## 2014-11-06 NOTE — Progress Notes (Addendum)
  Subjective:  Kelli Kennedy is a 34 y.o. [id number] African American female at 3845w5d by 7wk u/s, being seen today for her first obstetrical visit.  Her obstetrical history is significant for 1 uncomplicated term SVB, then c/s w/ 2nd pregnancy d/t transverse lie and declined ECV.  Pregnancy history fully reviewed.  Patient reports some nausea- diclegis helping. Some headaches. Denies vb, cramping, uti s/s, abnormal/malodorous vag d/c, or vulvovaginal itching/irritation.  BP 100/58 mmHg  Pulse 92  Wt 167 lb (75.751 kg)  LMP 06/05/2014  HISTORY: OB History  Gravida Para Term Preterm AB SAB TAB Ectopic Multiple Living  4 2 2  1 1    2     # Outcome Date GA Lbr Len/2nd Weight Sex Delivery Anes PTL Lv  4 Current           3 Term 12/18/09 7073w0d  6 lb 7 oz (2.92 kg) M CS-Unspec  N Y  2 SAB 2005          1 Term 09/23/98 4385w0d  7 lb 6 oz (3.345 kg) F Vag-Spont  Y Y     Past Medical History  Diagnosis Date  . Pregnant 10/17/2014  . Medical history non-contributory    Past Surgical History  Procedure Laterality Date  . Cesarean section    . Dilation and curettage of uterus    . Induced abortion  06/2014   History reviewed. No pertinent family history.  Exam   System:     General: Well developed & nourished, no acute distress   Skin: Warm & dry, normal coloration and turgor, no rashes   Neurologic: Alert & oriented, normal mood   Cardiovascular: Regular rate & rhythm   Respiratory: Effort & rate normal, LCTAB, acyanotic   Abdomen: Soft, non tender   Extremities: normal strength, tone  [facility]   Assessment:   Pregnancy: [id number] Patient Active Problem List   Diagnosis Date Noted  . Previous cesarean delivery, antepartum 11/06/2014    Priority: High  . Supervision of normal pregnancy 10/17/2014    Priority: High    6045w5d [id number] New OB visit Prev c/s for transverse lie Nausea of pregnancy  Headaches  Plan:  Initial labs  drawn Continue prenatal vitamins Problem list reviewed and updated Reviewed n/v relief measures and warning s/s to report Reviewed recommended weight gain based on pre-gravid BMI Encouraged well-balanced diet Genetic Screening discussed Integrated Screen: requested Cystic fibrosis screening discussed requested Ultrasound discussed; fetal survey: requested Follow up in 4 weeks for 1st it/nt and visit [facility] completed Gave printed info on headache prevention/relief measures Offered VBAC, discussed r/b, to take consent home & review and bring to next visit [facility]  Araceli Coufal Randall CNM, [facility]WHNP-BC 11/06/2014 11:51 AM

## 2014-11-08 LAB — URINE CULTURE

## 2014-11-08 LAB — GC/CHLAMYDIA PROBE AMP
Chlamydia trachomatis, NAA: NEGATIVE
Neisseria gonorrhoeae by PCR: NEGATIVE

## 2014-11-12 ENCOUNTER — Encounter: Payer: Self-pay | Admitting: *Deleted

## 2014-11-12 LAB — PMP SCREEN PROFILE (10S), URINE
Amphetamine Screen, Ur: NEGATIVE ng/mL
BENZODIAZEPINE SCREEN, URINE: NEGATIVE ng/mL
Barbiturate Screen, Ur: NEGATIVE ng/mL
CANNABINOIDS UR QL SCN: NEGATIVE ng/mL
COCAINE(METAB.) SCREEN, URINE: NEGATIVE ng/mL
CREATININE(CRT), U: 178.2 mg/dL (ref 20.0–300.0)
Methadone Scn, Ur: NEGATIVE ng/mL
OPIATE SCRN UR: NEGATIVE ng/mL
OXYCODONE+OXYMORPHONE UR QL SCN: NEGATIVE ng/mL
PCP SCRN UR: NEGATIVE ng/mL
Ph of Urine: 6.3 (ref 4.5–8.9)
Propoxyphene, Screen: NEGATIVE ng/mL

## 2014-11-12 LAB — MICROSCOPIC EXAMINATION: Casts: NONE SEEN /lpf

## 2014-11-12 LAB — ANTIBODY SCREEN: ANTIBODY SCREEN: NEGATIVE

## 2014-11-12 LAB — URINALYSIS, ROUTINE W REFLEX MICROSCOPIC
Bilirubin, UA: NEGATIVE
Glucose, UA: NEGATIVE
Ketones, UA: NEGATIVE
Nitrite, UA: NEGATIVE
PH UA: 7 (ref 5.0–7.5)
PROTEIN UA: NEGATIVE
RBC, UA: NEGATIVE
Specific Gravity, UA: 1.017 (ref 1.005–1.030)
Urobilinogen, Ur: 1 mg/dL (ref 0.2–1.0)

## 2014-11-12 LAB — CBC
HEMOGLOBIN: 12 g/dL (ref 11.1–15.9)
Hematocrit: 35.8 % (ref 34.0–46.6)
MCH: 29.1 pg (ref 26.6–33.0)
MCHC: 33.5 g/dL (ref 31.5–35.7)
MCV: 87 fL (ref 79–97)
Platelets: 355 10*3/uL (ref 150–379)
RBC: 4.13 x10E6/uL (ref 3.77–5.28)
RDW: 14.5 % (ref 12.3–15.4)
WBC: 6.6 10*3/uL (ref 3.4–10.8)

## 2014-11-12 LAB — RPR: RPR: NONREACTIVE

## 2014-11-12 LAB — HIV ANTIBODY (ROUTINE TESTING W REFLEX): HIV SCREEN 4TH GENERATION: NONREACTIVE

## 2014-11-12 LAB — RUBELLA SCREEN: Rubella Antibodies, IGG: 1 index (ref >0.99)

## 2014-11-12 LAB — HEPATITIS B SURFACE ANTIGEN: HEP B S AG: NEGATIVE

## 2014-11-12 LAB — CYSTIC FIBROSIS MUTATION 97

## 2014-11-12 LAB — VARICELLA ZOSTER ANTIBODY, IGG: Varicella zoster IgG: 980 index (ref >165)

## 2014-11-12 LAB — ABO/RH: Rh Factor: POSITIVE

## 2014-11-12 LAB — SICKLE CELL SCREEN: SICKLE CELL SCREEN: NEGATIVE

## 2014-11-14 ENCOUNTER — Telehealth: Payer: Self-pay | Admitting: Adult Health

## 2014-11-14 NOTE — Telephone Encounter (Signed)
Pt called stating that she is concerned because she is having less breast tenderness than she had been having and is not having any cramping and she is [redacted]wks pregnant and feels like she should "feel pregnant".  Pt denies any vaginal bleeding or cramping.  Informed her that every pregnancy is different and symptoms vary during pregnancy, advised to call us back if develops bleeding or severe cramping otherwise keep next scheduled appointment for routine OB and NT/IT.  Pt verbalized understanding.

## 2014-11-14 NOTE — Telephone Encounter (Signed)
Left message x 1. Number left was disconnected. I called [phone] # in chart. JSY

## 2014-11-15 ENCOUNTER — Telehealth: Payer: Self-pay | Admitting: *Deleted

## 2014-11-15 NOTE — Telephone Encounter (Signed)
Pt called and stated that she noticed some spotting this morning when wiped after going to the bathroom.  Pt states she had sex last night.  I informed her that it can be common in early pregnancy to have some spotting after sex and to just monitor it, if she develops cramping or increase bleeding or passing clots to call us back.  Advised pt to refrain from sex for a week or so and push fluids. Pt verbalized understanding

## 2014-11-16 ENCOUNTER — Encounter (HOSPITAL_COMMUNITY): Payer: Self-pay | Admitting: *Deleted

## 2014-11-16 ENCOUNTER — Inpatient Hospital Stay (HOSPITAL_COMMUNITY)
Admission: AD | Admit: 2014-11-16 | Discharge: 2014-11-16 | Disposition: A | Discharge status: Home / Self Care | Payer: BLUE CROSS/BLUE SHIELD | Source: Ambulatory Visit | Attending: Obstetrics & Gynecology | Admitting: Obstetrics & Gynecology

## 2014-11-16 DIAGNOSIS — O209 Hemorrhage in early pregnancy, unspecified: Secondary | ICD-10-CM | POA: Diagnosis present

## 2014-11-16 DIAGNOSIS — Z3A10 10 weeks gestation of pregnancy: Secondary | ICD-10-CM | POA: Insufficient documentation

## 2014-11-16 DIAGNOSIS — N930 Postcoital and contact bleeding: Secondary | ICD-10-CM | POA: Diagnosis not present

## 2014-11-16 DIAGNOSIS — Z87891 Personal history of nicotine dependence: Secondary | ICD-10-CM | POA: Diagnosis not present

## 2014-11-16 DIAGNOSIS — O208 Other hemorrhage in early pregnancy: Secondary | ICD-10-CM | POA: Insufficient documentation

## 2014-11-16 LAB — URINALYSIS, ROUTINE W REFLEX MICROSCOPIC
BILIRUBIN URINE: NEGATIVE
GLUCOSE, UA: NEGATIVE mg/dL
Ketones, ur: NEGATIVE mg/dL
Leukocytes, UA: NEGATIVE
Nitrite: NEGATIVE
Protein, ur: NEGATIVE mg/dL
SPECIFIC GRAVITY, URINE: 1.02 (ref 1.005–1.030)
Urobilinogen, UA: 1 mg/dL (ref 0.0–1.0)
pH: 7 (ref 5.0–8.0)

## 2014-11-16 LAB — URINE MICROSCOPIC-ADD ON

## 2014-11-16 LAB — WET PREP, GENITAL
Trich, Wet Prep: NONE SEEN
WBC, Wet Prep HPF POC: NONE SEEN
Yeast Wet Prep HPF POC: NONE SEEN

## 2014-11-16 NOTE — MAU Provider Note (Signed)
History     CSN: [id number]  Arrival date and time: 11/16/14 1032   First Provider Initiated Contact with Patient 11/16/14 1113      Chief Complaint  Patient presents with  . Vaginal Discharge   HPI  Ms. Kelli Kennedy is a 34 y.o. [id number] at 7344w1d who presents to MAU today with complaint of vaginal bleeding since last night. The patient states that bleeding was noted after intercourse last night. She states continued spotting today. She has not required a pad and not seen blood in her underwear. She denies other vaginal discharge, abdominal pain, fever, UTI symptoms or N/V/D. She denies complications with the pregnancy. She gets prenatal care with [facility].   OB History    Gravida Para Term Preterm AB TAB SAB Ectopic Multiple Living   4 2 2  1  1   2       Past Medical History  Diagnosis Date  . Pregnant 10/17/2014  . Medical history non-contributory     Past Surgical History  Procedure Laterality Date  . Cesarean section    . Dilation and curettage of uterus    . Induced abortion  06/2014    History reviewed. No pertinent family history.  History  Substance Use Topics  . Smoking status: Former Smoker    Types: Cigarettes  . Smokeless tobacco: Never Used  . Alcohol Use: No    Allergies: No Known Allergies  No prescriptions prior to admission    Review of Systems  Constitutional: Negative for fever and malaise/fatigue.  Gastrointestinal: Negative for nausea, vomiting, abdominal pain, diarrhea and constipation.  Genitourinary: Negative for dysuria, urgency and frequency.       + vaginal bleeding   Physical Exam   Blood pressure 129/69, pulse 87, temperature 98.3 F (36.8 C), temperature source Oral, resp. rate 18, last menstrual period 06/05/2014.  Physical Exam  Nursing note and vitals reviewed. Constitutional: She is oriented to person, place, and time. She appears well-developed and well-nourished. No distress.  HENT:  Head: Normocephalic and  atraumatic.  Cardiovascular: Normal rate.   Respiratory: Effort normal.  GI: Soft. She exhibits no distension and no mass. There is no tenderness. There is no rebound and no guarding.  Genitourinary: Uterus is enlarged (appropriate for GA). Uterus is not tender. Cervix exhibits no motion tenderness, no discharge and no friability. There is bleeding (scant dark pink blood noted) in the vagina. No vaginal discharge found.  Cervix: closed, thick, posterior  Neurological: She is alert and oriented to person, place, and time.  Skin: Skin is warm and dry. No erythema.  Psychiatric: She has a normal mood and affect.   Results for orders placed or performed during the hospital encounter of 11/16/14 (from the past 24 hour(s))  Urinalysis, Routine w reflex microscopic     Status: Abnormal   Collection Time: 11/16/14 10:55 AM  Result Value Ref Range   Color, Urine YELLOW YELLOW   APPearance CLEAR CLEAR   Specific Gravity, Urine 1.020 1.005 - 1.030   pH 7.0 5.0 - 8.0   Glucose, UA NEGATIVE NEGATIVE mg/dL   Hgb urine dipstick MODERATE (A) NEGATIVE   Bilirubin Urine NEGATIVE NEGATIVE   Ketones, ur NEGATIVE NEGATIVE mg/dL   Protein, ur NEGATIVE NEGATIVE mg/dL   Urobilinogen, UA 1.0 0.0 - 1.0 mg/dL   Nitrite NEGATIVE NEGATIVE   Leukocytes, UA NEGATIVE NEGATIVE  Urine microscopic-add on     Status: None   Collection Time: 11/16/14 10:55 AM  Result Value Ref  Range   Squamous Epithelial / LPF RARE RARE   RBC / HPF 0-2 <3 RBC/hpf   Bacteria, UA RARE RARE   Urine-Other MUCOUS PRESENT   Wet prep, genital     Status: Abnormal   Collection Time: 11/16/14 11:15 AM  Result Value Ref Range   Yeast Wet Prep HPF POC NONE SEEN NONE SEEN   Trich, Wet Prep NONE SEEN NONE SEEN   Clue Cells Wet Prep HPF POC FEW (A) NONE SEEN   WBC, Wet Prep HPF POC NONE SEEN NONE SEEN    MAU Course  Procedures  MDM FHR - 170 bpm with doppler Wet prep today Assessment and Plan  A: SIUP at 7479w1d Post-coital bleeding  in pregnancy  P: Discharge home Bleeding precautions discussed Patient [facility] or [facility] PRN Patient may return to MAU as needed or if her condition were to change or worsen   Marny LowensteinJulie N Wenzel, PA-C  11/16/2014, 11:58 AM

## 2014-11-16 NOTE — MAU Note (Signed)
Had intercourse yesterday morning, had a watery orange discharge afterwards.  Later had light pink discharge with wiping.  Started having mild cramping during the night, not having pain now, still having pink discharge with wiping.

## 2014-11-16 NOTE — Discharge Instructions (Signed)

## 2014-11-19 ENCOUNTER — Encounter: Payer: Self-pay | Admitting: Women's Health

## 2014-11-19 DIAGNOSIS — O09899 Supervision of other high risk pregnancies, unspecified trimester: Secondary | ICD-10-CM | POA: Insufficient documentation

## 2014-11-19 DIAGNOSIS — Z141 Cystic fibrosis carrier: Secondary | ICD-10-CM

## 2014-11-29 ENCOUNTER — Ambulatory Visit (INDEPENDENT_AMBULATORY_CARE_PROVIDER_SITE_OTHER): Payer: BLUE CROSS/BLUE SHIELD | Admitting: Advanced Practice Midwife

## 2014-11-29 ENCOUNTER — Ambulatory Visit (INDEPENDENT_AMBULATORY_CARE_PROVIDER_SITE_OTHER): Payer: BLUE CROSS/BLUE SHIELD

## 2014-11-29 ENCOUNTER — Encounter: Payer: Self-pay | Admitting: Advanced Practice Midwife

## 2014-11-29 VITALS — BP 102/70 | HR 84 | Wt 166.0 lb

## 2014-11-29 DIAGNOSIS — Z331 Pregnant state, incidental: Secondary | ICD-10-CM

## 2014-11-29 DIAGNOSIS — Z3491 Encounter for supervision of normal pregnancy, unspecified, first trimester: Secondary | ICD-10-CM

## 2014-11-29 DIAGNOSIS — Z3682 Encounter for antenatal screening for nuchal translucency: Secondary | ICD-10-CM

## 2014-11-29 DIAGNOSIS — Z36 Encounter for antenatal screening of mother: Secondary | ICD-10-CM | POA: Diagnosis not present

## 2014-11-29 DIAGNOSIS — Z1389 Encounter for screening for other disorder: Secondary | ICD-10-CM

## 2014-11-29 DIAGNOSIS — Z369 Encounter for antenatal screening, unspecified: Secondary | ICD-10-CM

## 2014-11-29 LAB — POCT URINALYSIS DIPSTICK
Glucose, UA: NEGATIVE
KETONES UA: NEGATIVE
Leukocytes, UA: NEGATIVE
NITRITE UA: NEGATIVE
Protein, UA: NEGATIVE

## 2014-11-29 NOTE — Progress Notes (Signed)
Pt states that she has had a headache since yesterday and she has not taken anything yet for the headache.

## 2014-11-29 NOTE — Patient Instructions (Signed)
Cystic fibrosis carrier

## 2014-11-29 NOTE — Progress Notes (Signed)
US 12WKS single IUP pos fht 167bpm,nb present, ant pl , normal ov's bilat,1.255mm nt

## 2014-11-29 NOTE — Progress Notes (Signed)
[id number] 3472w0d Estimated Date of Delivery: 06/13/15  Last menstrual period 06/05/2014.   BP weight and urine results all reviewed and noted.  Please refer to the obstetrical flow sheet for the fundal height and fetal heart rate documentation: Had US for NT/IT today: US 12WKS single IUP pos fht 167bpm,nb present, ant pl , normal ov's bilat,1.265mm nt   Patient denies any bleeding and no rupture of membranes symptoms or regular contractions. Informed of + CF carrier status. Pt will let us know if FOB will get tested All questions were answered.  Plan:  Continued routine obstetrical care,   Follow up in 4 weeks for OB appointment, 2nd IT

## 2014-12-01 LAB — MATERNAL SCREEN, INTEGRATED #1
CROWN RUMP LENGTH MAT SCREEN: 67.1 mm
Gest. Age on Collection Date: 12.9 weeks
Maternal Age at EDD: 33.4 years
Nuchal Translucency (NT): 1.5 mm
Number of Fetuses: 1
PAPP-A Value: 895.8 ng/mL
Weight: 166 [lb_av]

## 2014-12-27 ENCOUNTER — Encounter: Payer: Self-pay | Admitting: Advanced Practice Midwife

## 2014-12-27 ENCOUNTER — Ambulatory Visit (INDEPENDENT_AMBULATORY_CARE_PROVIDER_SITE_OTHER): Payer: BLUE CROSS/BLUE SHIELD | Admitting: Advanced Practice Midwife

## 2014-12-27 VITALS — BP 120/76 | HR 84 | Wt 166.5 lb

## 2014-12-27 DIAGNOSIS — Z1389 Encounter for screening for other disorder: Secondary | ICD-10-CM

## 2014-12-27 DIAGNOSIS — Z141 Cystic fibrosis carrier: Secondary | ICD-10-CM

## 2014-12-27 DIAGNOSIS — Z363 Encounter for antenatal screening for malformations: Secondary | ICD-10-CM

## 2014-12-27 DIAGNOSIS — Z331 Pregnant state, incidental: Secondary | ICD-10-CM

## 2014-12-27 DIAGNOSIS — O09892 Supervision of other high risk pregnancies, second trimester: Secondary | ICD-10-CM

## 2014-12-27 LAB — POCT URINALYSIS DIPSTICK
Blood, UA: NEGATIVE
Glucose, UA: NEGATIVE
Ketones, UA: NEGATIVE
Leukocytes, UA: NEGATIVE
Nitrite, UA: NEGATIVE

## 2014-12-27 NOTE — Progress Notes (Signed)
Pt wants discuss wt gain.

## 2014-12-27 NOTE — Progress Notes (Signed)
[id number] [redacted]w[redacted]d Estimated Date of Delivery: 06/13/15  Last menstrual period 06/05/2014.   BP weight and urine results all reviewed and noted.  Please refer to the obstetrical flow sheet for fetal heart rate documentation:  Patient  denies any bleeding and no rupture of membranes symptoms or regular contractions. Patient has some sinus pressure earlier this week, better now.   Feels like she eats "all the time".  All questions were answered.  Plan:  Continued routine obstetrical care, 2nd IT today  Follow up in 4 weeks for OB appointment, anatomy scan

## 2014-12-27 NOTE — Patient Instructions (Signed)
[email or web address] to register for waterbirth  Class or call  [phone]

## 2014-12-29 LAB — MATERNAL SCREEN, INTEGRATED #2
ADSF: 1.26
AFP MoM: 1.13
Alpha-Fetoprotein: 40.8 ng/mL
Crown Rump Length: 67.1 mm
DIA MoM: 1.13
DIA VALUE: 188.9 pg/mL
Estriol, Unconjugated: 1.24 ng/mL
GEST. AGE ON COLLECTION DATE: 12.9 wk
GESTATIONAL AGE: 16.9 wk
Maternal Age at EDD: 33.4 years
NUMBER OF FETUSES: 1
Nuchal Translucency (NT): 1.5 mm
Nuchal Translucency MoM: 1.01
PAPP-A MOM: 0.88
PAPP-A Value: 895.8 ng/mL
Test Results:: NEGATIVE
WEIGHT: 167 [lb_av]
Weight: 166 [lb_av]
hCG MoM: 1.51
hCG Value: 41.3 IU/mL

## 2015-01-16 ENCOUNTER — Telehealth: Payer: Self-pay | Admitting: Advanced Practice Midwife

## 2015-01-24 ENCOUNTER — Ambulatory Visit (INDEPENDENT_AMBULATORY_CARE_PROVIDER_SITE_OTHER): Payer: BLUE CROSS/BLUE SHIELD

## 2015-01-24 ENCOUNTER — Ambulatory Visit (INDEPENDENT_AMBULATORY_CARE_PROVIDER_SITE_OTHER): Payer: BLUE CROSS/BLUE SHIELD | Admitting: Advanced Practice Midwife

## 2015-01-24 VITALS — BP 124/74 | HR 83 | Wt 169.0 lb

## 2015-01-24 DIAGNOSIS — Z36 Encounter for antenatal screening of mother: Secondary | ICD-10-CM | POA: Diagnosis not present

## 2015-01-24 DIAGNOSIS — Z3492 Encounter for supervision of normal pregnancy, unspecified, second trimester: Secondary | ICD-10-CM

## 2015-01-24 DIAGNOSIS — Z3A20 20 weeks gestation of pregnancy: Secondary | ICD-10-CM

## 2015-01-24 DIAGNOSIS — Z3482 Encounter for supervision of other normal pregnancy, second trimester: Secondary | ICD-10-CM | POA: Diagnosis not present

## 2015-01-24 DIAGNOSIS — O34219 Maternal care for unspecified type scar from previous cesarean delivery: Secondary | ICD-10-CM

## 2015-01-24 DIAGNOSIS — Z1389 Encounter for screening for other disorder: Secondary | ICD-10-CM

## 2015-01-24 DIAGNOSIS — Z363 Encounter for antenatal screening for malformations: Secondary | ICD-10-CM

## 2015-01-24 DIAGNOSIS — Z331 Pregnant state, incidental: Secondary | ICD-10-CM

## 2015-01-24 DIAGNOSIS — O3421 Maternal care for scar from previous cesarean delivery: Secondary | ICD-10-CM

## 2015-01-24 NOTE — Progress Notes (Signed)
[id number] [redacted]w[redacted]d Estimated Date of Delivery: 06/13/15  Blood pressure 124/74, pulse 83, weight 169 lb (76.658 kg), last menstrual period 06/05/2014.   BP weight and urine results all reviewed and noted.  Please refer to the obstetrical flow sheet for the fundal height and fetal heart rate documentation:US 20wks measurements c/w dates, efw 385g,normal ov's bilat,cephalic,ant pl gr 0,afi 5.3cm,fht 161bpm,cx 4cm,anatomy complete w/no obvious abn seen.   Patient reports good fetal movement, denies any bleeding and no rupture of membranes symptoms or regular contractions. Patient is without complaints. All questions were answered.  Plan:  Continued routine obstetrical care, VBAC consent signed today  Follow up in 4 weeks for OB appointment,

## 2015-01-24 NOTE — Progress Notes (Signed)
[place] 20wks measurements c/w dates, efw  385g,normal ov's bilat,cephalic,ant pl gr 0,afi 5.3cm,fht 161bpm,cx 4cm,anatomy complete w/no obvious abn seen.

## 2015-02-21 ENCOUNTER — Ambulatory Visit (INDEPENDENT_AMBULATORY_CARE_PROVIDER_SITE_OTHER): Payer: BLUE CROSS/BLUE SHIELD | Admitting: Advanced Practice Midwife

## 2015-02-21 ENCOUNTER — Encounter: Payer: Self-pay | Admitting: Advanced Practice Midwife

## 2015-02-21 VITALS — BP 100/60 | HR 76 | Wt 169.0 lb

## 2015-02-21 DIAGNOSIS — Z3491 Encounter for supervision of normal pregnancy, unspecified, first trimester: Secondary | ICD-10-CM | POA: Diagnosis not present

## 2015-02-21 DIAGNOSIS — Z3492 Encounter for supervision of normal pregnancy, unspecified, second trimester: Secondary | ICD-10-CM

## 2015-02-21 DIAGNOSIS — Z1389 Encounter for screening for other disorder: Secondary | ICD-10-CM

## 2015-02-21 DIAGNOSIS — Z331 Pregnant state, incidental: Secondary | ICD-10-CM

## 2015-02-21 LAB — POCT URINALYSIS DIPSTICK
Blood, UA: NEGATIVE
Glucose, UA: NEGATIVE
Ketones, UA: NEGATIVE
LEUKOCYTES UA: NEGATIVE
NITRITE UA: NEGATIVE
Protein, UA: NEGATIVE

## 2015-02-21 NOTE — Patient Instructions (Signed)

## 2015-02-21 NOTE — Progress Notes (Signed)
[id number] [redacted]w[redacted]d Estimated Date of Delivery: 06/13/15  Blood pressure 100/60, pulse 76, weight 169 lb (76.658 kg), last menstrual period 06/05/2014.   BP weight and urine results all reviewed and noted.  Please refer to the obstetrical flow sheet for the fundal height and fetal heart rate documentation:  Patient reports good fetal movement, denies any bleeding and no rupture of membranes symptoms or regular contractions. Patient is without complaints. " I eat all the time"  All questions were answered.  Plan:  Continued routine obstetrical care,   Follow up in 3 weeks for OB appointment, PN2

## 2015-03-15 ENCOUNTER — Other Ambulatory Visit: Payer: BLUE CROSS/BLUE SHIELD

## 2015-03-15 ENCOUNTER — Encounter: Payer: Self-pay | Admitting: Obstetrics and Gynecology

## 2015-03-15 ENCOUNTER — Ambulatory Visit (INDEPENDENT_AMBULATORY_CARE_PROVIDER_SITE_OTHER): Payer: BLUE CROSS/BLUE SHIELD | Admitting: Obstetrics and Gynecology

## 2015-03-15 VITALS — BP 104/66 | HR 84 | Wt 170.0 lb

## 2015-03-15 DIAGNOSIS — Z131 Encounter for screening for diabetes mellitus: Secondary | ICD-10-CM

## 2015-03-15 DIAGNOSIS — Z331 Pregnant state, incidental: Secondary | ICD-10-CM

## 2015-03-15 DIAGNOSIS — Z1389 Encounter for screening for other disorder: Secondary | ICD-10-CM

## 2015-03-15 DIAGNOSIS — Z3493 Encounter for supervision of normal pregnancy, unspecified, third trimester: Secondary | ICD-10-CM

## 2015-03-15 DIAGNOSIS — Z369 Encounter for antenatal screening, unspecified: Secondary | ICD-10-CM

## 2015-03-15 LAB — POCT URINALYSIS DIPSTICK
Blood, UA: NEGATIVE
Blood, UA: NEGATIVE
Glucose, UA: NEGATIVE
Glucose, UA: NEGATIVE
Ketones, UA: NEGATIVE
Ketones, UA: NEGATIVE
Leukocytes, UA: NEGATIVE
Leukocytes, UA: NEGATIVE
Nitrite, UA: NEGATIVE
Nitrite, UA: NEGATIVE
Protein, UA: NEGATIVE

## 2015-03-15 NOTE — Progress Notes (Signed)
Pt denies any problems or concerns at this time.  

## 2015-03-15 NOTE — Progress Notes (Signed)
Patient ID: Kelli Kennedy, female   DOB: 10/03/1980, 34 y.o.   MRN: [id number] [redacted]w[redacted]d Estimated Date of Delivery: 06/13/15  Blood pressure 104/66, pulse 84, weight 170 lb (77.111 kg), last menstrual period 06/05/2014.   refer to the ob flow sheet for FH and FHR, also BP, Wt, Urine results:notable for   Patient reports good fetal movement, denies any bleeding and no rupture of membranes symptoms or regular contractions. Patient complaints: Pt has not complaints at this time. Pt reports she intends to deliver vaginally. Pt reports she had a c-section during her last pregnancy because the baby was sideways and she was unable to deliver vaginally. Pt reports she wishes to get her tubes tied after the birth of her baby.  FHR: 152 FH: 25cm  Questions were answered. Assessment: Prior cesarean desiring TOLAC Discussed vaginal delivery v. c-section                                                                                       Consents for tolac signed already.                        Desires permanent sterilization: discussed options for sterilization, including LARC, PPD1 tubal ligation, vs delayed interval sterilization with salpingectomy at 4wk or 6 months., with review of cancer risk-reduction of salpingectomy, pt at this time prefers 4 wk pp salpingectomy.  Plan:   1. Continued routine obstetrical care 2. Tubal ligation 4 weeks after surgery.  3. F/u in 4 weeks for OB care 4. Tubal Ligation consent signed today   I personally performed the services described in this documentation, which was SCRIBED in my presence. The recorded information has been reviewed and considered accurate. It has been edited as necessary during review. Tilda Burrow, MD  This chart was SCRIBED for Christin Bach, MD by Marica Otter, ED Scribe. This patient was seen in room 3, and the patient's care was started at 10:23 AM.

## 2015-03-16 LAB — CBC
Hematocrit: 34.9 % (ref 34.0–46.6)
Hemoglobin: 11.5 g/dL (ref 11.1–15.9)
MCH: 29.8 pg (ref 26.6–33.0)
MCHC: 33 g/dL (ref 31.5–35.7)
MCV: 90 fL (ref 79–97)
PLATELETS: 291 10*3/uL (ref 150–379)
RBC: 3.86 x10E6/uL (ref 3.77–5.28)
RDW: 14.2 % (ref 12.3–15.4)
WBC: 6.4 10*3/uL (ref 3.4–10.8)

## 2015-03-16 LAB — HIV ANTIBODY (ROUTINE TESTING W REFLEX): HIV Screen 4th Generation wRfx: NONREACTIVE

## 2015-03-16 LAB — GLUCOSE TOLERANCE, 2 HOURS W/ 1HR
GLUCOSE, 2 HOUR: 141 mg/dL (ref 65–152)
Glucose, 1 hour: 139 mg/dL (ref 65–179)
Glucose, Fasting: 87 mg/dL (ref 65–91)

## 2015-03-16 LAB — ANTIBODY SCREEN: ANTIBODY SCREEN: NEGATIVE

## 2015-03-16 LAB — RPR: RPR Ser Ql: NONREACTIVE

## 2015-03-16 LAB — HSV 2 ANTIBODY, IGG: HSV 2 Glycoprotein G Ab, IgG: <0.91 index (ref 0.00–0.90)

## 2015-04-15 ENCOUNTER — Ambulatory Visit (INDEPENDENT_AMBULATORY_CARE_PROVIDER_SITE_OTHER): Payer: Medicaid Other | Admitting: Women's Health

## 2015-04-15 VITALS — BP 110/70 | HR 86 | Wt 169.5 lb

## 2015-04-15 DIAGNOSIS — Z3493 Encounter for supervision of normal pregnancy, unspecified, third trimester: Secondary | ICD-10-CM

## 2015-04-15 DIAGNOSIS — Z23 Encounter for immunization: Secondary | ICD-10-CM | POA: Diagnosis not present

## 2015-04-15 DIAGNOSIS — Z331 Pregnant state, incidental: Secondary | ICD-10-CM

## 2015-04-15 DIAGNOSIS — Z1389 Encounter for screening for other disorder: Secondary | ICD-10-CM

## 2015-04-15 LAB — POCT URINALYSIS DIPSTICK
GLUCOSE UA: NEGATIVE
Leukocytes, UA: NEGATIVE
Nitrite, UA: NEGATIVE
RBC UA: NEGATIVE

## 2015-04-15 NOTE — Progress Notes (Signed)
Low-risk OB appointment [id number] [redacted]w[redacted]d Estimated Date of Delivery: 06/13/15 BP 110/70 mmHg  Pulse 86  Wt 169 lb 8 oz (76.885 kg)  LMP 06/05/2014  BP, weight, and urine reviewed.  Refer to obstetrical flow sheet for FH & FHR.  Reports good fm.  Denies regular uc's, lof, vb, or uti s/s. No complaints. Reviewed normal pn2 results, ptl s/s, fkc. Recommended Tdap at HD/PCP per CDC guidelines.  Plan:  Continue routine obstetrical care  F/U in 2wks for OB appointment  Flu shot today

## 2015-04-15 NOTE — Patient Instructions (Signed)
[place] Pediatricians/Family Doctors:  [facility] [phone]            [facility] [phone]                 [facility] [phone] (usually not accepting new patients unless you have family there already, you are always welcome to call and ask)            [facility] [phone] 3rd [place]) [phone]   [facility] Pediatricians/Family Doctors:   [facility]: [phone]  [facility]: [phone]   Call the office [phone]) or go to [facility] if:  You begin to have strong, frequent contractions  Your water breaks.  Sometimes it is a big gush of fluid, sometimes it is just a trickle that keeps getting your panties wet or running down your legs  You have vaginal bleeding.  It is normal to have a small amount of spotting if your cervix was checked.   You don't feel your baby moving like normal.  If you don't, get you something to eat and drink and lay down and focus on feeling your baby move.  You should feel at least 10 movements in 2 hours.  If you don't, you should call the office or go to [facility] ([facility]).    Tdap Vaccine  It is recommended that you get the Tdap vaccine during the third trimester of EACH pregnancy to help protect your baby from getting pertussis (whooping cough)  27-36 weeks is the BEST time to do this so that you can pass the protection on to your baby. During pregnancy is better than after pregnancy, but if you are unable to get it during pregnancy it will be offered at the hospital.   You can get this vaccine at the health department or your family doctor  Everyone who will be around your baby should also be up-to-date on their vaccines. Adults (who are not pregnant) only need 1 dose of Tdap during adulthood.      Preterm Labor Information Preterm labor is when labor starts at less than 37 weeks of pregnancy. The normal length of a pregnancy is 39 to 41  weeks. CAUSES Often, there is no identifiable underlying cause as to why a woman goes into preterm labor. One of the most common known causes of preterm labor is infection. Infections of the uterus, cervix, vagina, amniotic sac, bladder, kidney, or even the lungs (pneumonia) can cause labor to start. Other suspected causes of preterm labor include:  9. Urogenital infections, such as yeast infections and bacterial vaginosis.  10. Uterine abnormalities (uterine shape, uterine septum, fibroids, or bleeding from the placenta).  11. A cervix that has been operated on (it may fail to stay closed).  12. Malformations in the fetus.  13. Multiple gestations (twins, triplets, and so on).  14. Breakage of the amniotic sac.  RISK FACTORS 3. Having a previous history of preterm labor.  4. Having premature rupture of membranes (PROM).  5. Having a placenta that covers the opening of the cervix (placenta previa).  6. Having a placenta that separates from the uterus (placental abruption).  7. Having a cervix that is too weak to hold the fetus in the uterus (incompetent cervix).  8. Having too much fluid in the amniotic sac (polyhydramnios).  9. Taking illegal drugs or smoking while pregnant.  10. Not gaining enough weight while pregnant.  11. Being younger than 78 and older than 33 years old.  12.  Having a low socioeconomic status.  13. Being African American. SYMPTOMS Signs and symptoms of preterm labor include:   Menstrual-like cramps, abdominal pain, or back pain.  Uterine contractions that are regular, as frequent as six in an hour, regardless of their intensity (may be mild or painful).  Contractions that start on the top of the uterus and spread down to the lower abdomen and back.   A sense of increased pelvic pressure.   A watery or bloody mucus discharge that comes from the vagina.  TREATMENT Depending on the length of the pregnancy and other circumstances, your health  care provider may suggest bed rest. If necessary, there are medicines that can be given to stop contractions and to mature the fetal lungs. If labor happens before 34 weeks of pregnancy, a prolonged hospital stay may be recommended. Treatment depends on the condition of both you and the fetus.  WHAT SHOULD YOU DO IF YOU THINK YOU ARE IN PRETERM LABOR? Call your health care provider right away. You will need to go to the hospital to get checked immediately. HOW CAN YOU PREVENT PRETERM LABOR IN FUTURE PREGNANCIES? You should:   Stop smoking if you smoke.  Maintain healthy weight gain and avoid chemicals and drugs that are not necessary.  Be watchful for any type of infection.  Inform your health care provider if you have a known history of preterm labor.   This information is not intended to replace advice given to you by your health care provider. Make sure you discuss any questions you have with your health care provider.   Document Released: 09/12/2003 Document Revised: 02/22/2013 Document Reviewed: 07/25/2012 Elsevier Interactive Patient Education [employer]

## 2015-04-29 ENCOUNTER — Ambulatory Visit (INDEPENDENT_AMBULATORY_CARE_PROVIDER_SITE_OTHER): Payer: Medicaid Other | Admitting: Women's Health

## 2015-04-29 ENCOUNTER — Encounter: Payer: Self-pay | Admitting: Women's Health

## 2015-04-29 VITALS — BP 110/62 | HR 84 | Wt 170.5 lb

## 2015-04-29 DIAGNOSIS — Z331 Pregnant state, incidental: Secondary | ICD-10-CM

## 2015-04-29 DIAGNOSIS — Z3493 Encounter for supervision of normal pregnancy, unspecified, third trimester: Secondary | ICD-10-CM

## 2015-04-29 DIAGNOSIS — Z1389 Encounter for screening for other disorder: Secondary | ICD-10-CM

## 2015-04-29 LAB — POCT URINALYSIS DIPSTICK
Blood, UA: NEGATIVE
Glucose, UA: NEGATIVE
Ketones, UA: NEGATIVE
LEUKOCYTES UA: NEGATIVE
NITRITE UA: NEGATIVE
Protein, UA: NEGATIVE

## 2015-04-29 NOTE — Progress Notes (Signed)
Low-risk OB appointment [id number] 478w4d Estimated Date of Delivery: 06/13/15 BP 110/62 mmHg  Pulse 84  Wt 170 lb 8 oz (77.338 kg)  LMP 06/05/2014  BP, weight, and urine reviewed.  Refer to obstetrical flow sheet for FH & FHR.  Reports good fm.  Denies regular uc's, lof, vb, or uti s/s. No complaints. Reviewed ptl s/s, fkc. Plan:  Continue routine obstetrical care  F/U in 2wks for OB appointment

## 2015-04-29 NOTE — Patient Instructions (Signed)
Call the office ([phone]) or go to [facility] if:  You begin to have strong, frequent contractions  Your water breaks.  Sometimes it is a big gush of fluid, sometimes it is just a trickle that keeps getting your panties wet or running down your legs  You have vaginal bleeding.  It is normal to have a small amount of spotting if your cervix was checked.   You don't feel your baby moving like normal.  If you don't, get you something to eat and drink and lay down and focus on feeling your baby move.  You should feel at least 10 movements in 2 hours.  If you don't, you should call the office or go to [facility].    Preterm Labor Information Preterm labor is when labor starts at less than 37 weeks of pregnancy. The normal length of a pregnancy is 39 to 41 weeks. CAUSES Often, there is no identifiable underlying cause as to why a woman goes into preterm labor. One of the most common known causes of preterm labor is infection. Infections of the uterus, cervix, vagina, amniotic sac, bladder, kidney, or even the lungs (pneumonia) can cause labor to start. Other suspected causes of preterm labor include:   Urogenital infections, such as yeast infections and bacterial vaginosis.   Uterine abnormalities (uterine shape, uterine septum, fibroids, or bleeding from the placenta).   A cervix that has been operated on (it may fail to stay closed).   Malformations in the fetus.   Multiple gestations (twins, triplets, and so on).   Breakage of the amniotic sac.  RISK FACTORS  Having a previous history of preterm labor.   Having premature rupture of membranes (PROM).   Having a placenta that covers the opening of the cervix (placenta previa).   Having a placenta that separates from the uterus (placental abruption).   Having a cervix that is too weak to hold the fetus in the uterus (incompetent cervix).   Having too much fluid in the amniotic sac (polyhydramnios).   Taking  illegal drugs or smoking while pregnant.   Not gaining enough weight while pregnant.   Being younger than 18 and older than 33 years old.   Having a low socioeconomic status.   Being African American. SYMPTOMS Signs and symptoms of preterm labor include:   Menstrual-like cramps, abdominal pain, or back pain.  Uterine contractions that are regular, as frequent as six in an hour, regardless of their intensity (may be mild or painful).  Contractions that start on the top of the uterus and spread down to the lower abdomen and back.   A sense of increased pelvic pressure.   A watery or bloody mucus discharge that comes from the vagina.  TREATMENT Depending on the length of the pregnancy and other circumstances, your health care provider may suggest bed rest. If necessary, there are medicines that can be given to stop contractions and to mature the fetal lungs. If labor happens before 34 weeks of pregnancy, a prolonged hospital stay may be recommended. Treatment depends on the condition of both you and the fetus.  WHAT SHOULD YOU DO IF YOU THINK YOU ARE IN PRETERM LABOR? Call your health care provider right away. You will need to go to the hospital to get checked immediately. HOW CAN YOU PREVENT PRETERM LABOR IN FUTURE PREGNANCIES? You should:   Stop smoking if you smoke.  Maintain healthy weight gain and avoid chemicals and drugs that are not necessary.  Be watchful for   any type of infection.  Inform your health care provider if you have a known history of preterm labor.   This information is not intended to replace advice given to you by your health care provider. Make sure you discuss any questions you have with your health care provider.   Document Released: 09/12/2003 Document Revised: 02/22/2013 Document Reviewed: 07/25/2012 Elsevier Interactive Patient Education 2016 Else[employer] Inc[employer]  

## 2015-04-29 NOTE — Progress Notes (Signed)
Pt denies any problems or concerns at this time.  

## 2015-05-13 ENCOUNTER — Ambulatory Visit (INDEPENDENT_AMBULATORY_CARE_PROVIDER_SITE_OTHER): Payer: Medicaid Other | Admitting: Women's Health

## 2015-05-13 ENCOUNTER — Encounter: Payer: Self-pay | Admitting: Women's Health

## 2015-05-13 VITALS — BP 100/58 | HR 80 | Wt 171.0 lb

## 2015-05-13 DIAGNOSIS — Z1389 Encounter for screening for other disorder: Secondary | ICD-10-CM

## 2015-05-13 DIAGNOSIS — Z3493 Encounter for supervision of normal pregnancy, unspecified, third trimester: Secondary | ICD-10-CM

## 2015-05-13 DIAGNOSIS — Z331 Pregnant state, incidental: Secondary | ICD-10-CM

## 2015-05-13 LAB — POCT URINALYSIS DIPSTICK
Blood, UA: NEGATIVE
GLUCOSE UA: NEGATIVE
Ketones, UA: NEGATIVE
LEUKOCYTES UA: NEGATIVE
Nitrite, UA: NEGATIVE

## 2015-05-13 NOTE — Patient Instructions (Signed)
Call the office ([phone]) or go to [facility] if:  You begin to have strong, frequent contractions  Your water breaks.  Sometimes it is a big gush of fluid, sometimes it is just a trickle that keeps getting your panties wet or running down your legs  You have vaginal bleeding.  It is normal to have a small amount of spotting if your cervix was checked.   You don't feel your baby moving like normal.  If you don't, get you something to eat and drink and lay down and focus on feeling your baby move.  You should feel at least 10 movements in 2 hours.  If you don't, you should call the office or go to [facility].    Preterm Labor Information Preterm labor is when labor starts at less than 37 weeks of pregnancy. The normal length of a pregnancy is 39 to 41 weeks. CAUSES Often, there is no identifiable underlying cause as to why a woman goes into preterm labor. One of the most common known causes of preterm labor is infection. Infections of the uterus, cervix, vagina, amniotic sac, bladder, kidney, or even the lungs (pneumonia) can cause labor to start. Other suspected causes of preterm labor include:   Urogenital infections, such as yeast infections and bacterial vaginosis.   Uterine abnormalities (uterine shape, uterine septum, fibroids, or bleeding from the placenta).   A cervix that has been operated on (it may fail to stay closed).   Malformations in the fetus.   Multiple gestations (twins, triplets, and so on).   Breakage of the amniotic sac.  RISK FACTORS  Having a previous history of preterm labor.   Having premature rupture of membranes (PROM).   Having a placenta that covers the opening of the cervix (placenta previa).   Having a placenta that separates from the uterus (placental abruption).   Having a cervix that is too weak to hold the fetus in the uterus (incompetent cervix).   Having too much fluid in the amniotic sac (polyhydramnios).   Taking  illegal drugs or smoking while pregnant.   Not gaining enough weight while pregnant.   Being younger than 18 and older than 33 years old.   Having a low socioeconomic status.   Being African American. SYMPTOMS Signs and symptoms of preterm labor include:   Menstrual-like cramps, abdominal pain, or back pain.  Uterine contractions that are regular, as frequent as six in an hour, regardless of their intensity (may be mild or painful).  Contractions that start on the top of the uterus and spread down to the lower abdomen and back.   A sense of increased pelvic pressure.   A watery or bloody mucus discharge that comes from the vagina.  TREATMENT Depending on the length of the pregnancy and other circumstances, your health care provider may suggest bed rest. If necessary, there are medicines that can be given to stop contractions and to mature the fetal lungs. If labor happens before 34 weeks of pregnancy, a prolonged hospital stay may be recommended. Treatment depends on the condition of both you and the fetus.  WHAT SHOULD YOU DO IF YOU THINK YOU ARE IN PRETERM LABOR? Call your health care provider right away. You will need to go to the hospital to get checked immediately. HOW CAN YOU PREVENT PRETERM LABOR IN FUTURE PREGNANCIES? You should:   Stop smoking if you smoke.  Maintain healthy weight gain and avoid chemicals and drugs that are not necessary.  Be watchful for   any type of infection.  Inform your health care provider if you have a known history of preterm labor.   This information is not intended to replace advice given to you by your health care provider. Make sure you discuss any questions you have with your health care provider.   Document Released: 09/12/2003 Document Revised: 02/22/2013 Document Reviewed: 07/25/2012 Elsevier Interactive Patient Education 2016 Else[employer] Inc[employer]  

## 2015-05-13 NOTE — Progress Notes (Signed)
Low-risk OB appointment [id number] 4811w4d Estimated Date of Delivery: 06/13/15 BP 100/58 mmHg  Pulse 80  Wt 171 lb (77.565 kg)  LMP 06/05/2014  BP, weight, and urine reviewed.  Refer to obstetrical flow sheet for FH & FHR.  Reports good fm.  Denies regular uc's, lof, vb, or uti s/s. Some pressure and uc's- wants cx checked SVE per request: LTC, ballotable Reviewed ptl s/s, fkc. Plan:  Continue routine obstetrical care  F/U in 1wk for OB appointment and gbs

## 2015-05-20 ENCOUNTER — Ambulatory Visit (INDEPENDENT_AMBULATORY_CARE_PROVIDER_SITE_OTHER): Payer: Medicaid Other | Admitting: Women's Health

## 2015-05-20 ENCOUNTER — Encounter: Payer: Self-pay | Admitting: Women's Health

## 2015-05-20 VITALS — BP 100/60 | HR 80 | Wt 173.3 lb

## 2015-05-20 DIAGNOSIS — Z3483 Encounter for supervision of other normal pregnancy, third trimester: Secondary | ICD-10-CM

## 2015-05-20 DIAGNOSIS — Z331 Pregnant state, incidental: Secondary | ICD-10-CM

## 2015-05-20 DIAGNOSIS — Z3A36 36 weeks gestation of pregnancy: Secondary | ICD-10-CM

## 2015-05-20 DIAGNOSIS — Z1389 Encounter for screening for other disorder: Secondary | ICD-10-CM

## 2015-05-20 DIAGNOSIS — Z3685 Encounter for antenatal screening for Streptococcus B: Secondary | ICD-10-CM

## 2015-05-20 DIAGNOSIS — Z3493 Encounter for supervision of normal pregnancy, unspecified, third trimester: Secondary | ICD-10-CM

## 2015-05-20 DIAGNOSIS — Z118 Encounter for screening for other infectious and parasitic diseases: Secondary | ICD-10-CM

## 2015-05-20 DIAGNOSIS — Z1159 Encounter for screening for other viral diseases: Secondary | ICD-10-CM

## 2015-05-20 LAB — POCT URINALYSIS DIPSTICK
Blood, UA: NEGATIVE
GLUCOSE UA: NEGATIVE
Leukocytes, UA: NEGATIVE
Nitrite, UA: NEGATIVE
Protein, UA: NEGATIVE

## 2015-05-20 LAB — OB RESULTS CONSOLE GBS: GBS: POSITIVE

## 2015-05-20 NOTE — Progress Notes (Signed)
Low-risk OB appointment [id number] 9583w4d Estimated Date of Delivery: 06/13/15 BP 100/60 mmHg  Pulse 80  Wt 173 lb 4.8 oz (78.608 kg)  LMP 06/05/2014  BP, weight, and urine reviewed.  Refer to obstetrical flow sheet for FH & FHR.  Reports good fm.  Denies regular uc's, lof, vb, or uti s/s. No complaints. GBS collected SVE per request: LTC, vtx Reviewed labor s/s, fkc. Plan:  Continue routine obstetrical care  F/U in 1wk for OB appointment

## 2015-05-20 NOTE — Patient Instructions (Signed)
Call the office ([phone]) or go to [facility] if:  You begin to have strong, frequent contractions  Your water breaks.  Sometimes it is a big gush of fluid, sometimes it is just a trickle that keeps getting your panties wet or running down your legs  You have vaginal bleeding.  It is normal to have a small amount of spotting if your cervix was checked.   You don't feel your baby moving like normal.  If you don't, get you something to eat and drink and lay down and focus on feeling your baby move.  You should feel at least 10 movements in 2 hours.  If you don't, you should call the office or go to [facility].    Braxton Hicks Contractions Contractions of the uterus can occur throughout pregnancy. Contractions are not always a sign that you are in labor.  WHAT ARE BRAXTON HICKS CONTRACTIONS?  Contractions that occur before labor are called Braxton Hicks contractions, or false labor. Toward the end of pregnancy (32-34 weeks), these contractions can develop more often and may become more forceful. This is not true labor because these contractions do not result in opening (dilatation) and thinning of the cervix. They are sometimes difficult to tell apart from true labor because these contractions can be forceful and people have different pain tolerances. You should not feel embarrassed if you go to the hospital with false labor. Sometimes, the only way to tell if you are in true labor is for your health care provider to look for changes in the cervix. If there are no prenatal problems or other health problems associated with the pregnancy, it is completely safe to be sent home with false labor and await the onset of true labor. HOW CAN YOU TELL THE DIFFERENCE BETWEEN TRUE AND FALSE LABOR? False Labor  The contractions of false labor are usually shorter and not as hard as those of true labor.   The contractions are usually irregular.   The contractions are often felt in the front of  the lower abdomen and in the groin.   The contractions may go away when you walk around or change positions while lying down.   The contractions get weaker and are shorter lasting as time goes on.   The contractions do not usually become progressively stronger, regular, and closer together as with true labor.  True Labor  Contractions in true labor last 30-70 seconds, become very regular, usually become more intense, and increase in frequency.   The contractions do not go away with walking.   The discomfort is usually felt in the top of the uterus and spreads to the lower abdomen and low back.   True labor can be determined by your health care provider with an exam. This will show that the cervix is dilating and getting thinner.  WHAT TO REMEMBER  Keep up with your usual exercises and follow other instructions given by your health care provider.   Take medicines as directed by your health care provider.   Keep your regular prenatal appointments.   Eat and drink lightly if you think you are going into labor.   If Braxton Hicks contractions are making you uncomfortable:   Change your position from lying down or resting to walking, or from walking to resting.   Sit and rest in a tub of warm water.   Drink 2-3 glasses of water. Dehydration may cause these contractions.   Do slow and deep breathing several times an hour.    WHEN SHOULD I SEEK IMMEDIATE MEDICAL CARE? Seek immediate medical care if:  Your contractions become stronger, more regular, and closer together.   You have fluid leaking or gushing from your vagina.   You have a fever.   You pass blood-tinged mucus.   You have vaginal bleeding.   You have continuous abdominal pain.   You have low back pain that you never had before.   You feel your baby's head pushing down and causing pelvic pressure.   Your baby is not moving as much as it used to.    This information is not intended to  replace advice given to you by your health care provider. Make sure you discuss any questions you have with your health care provider.   Document Released: 06/22/2005 Document Revised: 06/27/2013 Document Reviewed: 04/03/2013 Elsevier Interactive Patient Education 2016 [employer]  

## 2015-05-21 LAB — GC/CHLAMYDIA PROBE AMP
CHLAMYDIA, DNA PROBE: NEGATIVE
NEISSERIA GONORRHOEAE BY PCR: NEGATIVE

## 2015-05-23 LAB — CULTURE, BETA STREP (GROUP B ONLY): STREP GP B CULTURE: POSITIVE — AB

## 2015-05-27 ENCOUNTER — Encounter: Payer: Self-pay | Admitting: Women's Health

## 2015-05-27 ENCOUNTER — Ambulatory Visit (INDEPENDENT_AMBULATORY_CARE_PROVIDER_SITE_OTHER): Payer: Medicaid Other | Admitting: Women's Health

## 2015-05-27 VITALS — BP 100/60 | HR 92 | Wt 170.0 lb

## 2015-05-27 DIAGNOSIS — Z1389 Encounter for screening for other disorder: Secondary | ICD-10-CM

## 2015-05-27 DIAGNOSIS — Z331 Pregnant state, incidental: Secondary | ICD-10-CM

## 2015-05-27 DIAGNOSIS — Z3493 Encounter for supervision of normal pregnancy, unspecified, third trimester: Secondary | ICD-10-CM

## 2015-05-27 LAB — POCT URINALYSIS DIPSTICK
Glucose, UA: NEGATIVE
Ketones, UA: NEGATIVE
Leukocytes, UA: NEGATIVE
Nitrite, UA: NEGATIVE
PROTEIN UA: NEGATIVE
RBC UA: NEGATIVE

## 2015-05-27 NOTE — Progress Notes (Signed)
Low-risk OB appointment [id number] Estimated Date of Delivery: 06/13/15 BP 100/60 mmHg  Pulse 92  Wt 170 lb (77.111 kg)  LMP 06/05/2014  BP, weight, and urine reviewed.  Refer to obstetrical flow sheet for FH & FHR.  Reports good fm.  Denies regular uc's, lof, vb, or uti s/s. No complaints. Reviewed gbs+, labor s/s, fkc. Plan:  Continue routine obstetrical care  F/U in 1wk for OB appointment

## 2015-05-27 NOTE — Patient Instructions (Signed)
Call the office ([phone]) or go to [facility] if:  You begin to have strong, frequent contractions  Your water breaks.  Sometimes it is a big gush of fluid, sometimes it is just a trickle that keeps getting your panties wet or running down your legs  You have vaginal bleeding.  It is normal to have a small amount of spotting if your cervix was checked.   You don't feel your baby moving like normal.  If you don't, get you something to eat and drink and lay down and focus on feeling your baby move.  You should feel at least 10 movements in 2 hours.  If you don't, you should call the office or go to [facility].    Braxton Hicks Contractions Contractions of the uterus can occur throughout pregnancy. Contractions are not always a sign that you are in labor.  WHAT ARE BRAXTON HICKS CONTRACTIONS?  Contractions that occur before labor are called Braxton Hicks contractions, or false labor. Toward the end of pregnancy (32-34 weeks), these contractions can develop more often and may become more forceful. This is not true labor because these contractions do not result in opening (dilatation) and thinning of the cervix. They are sometimes difficult to tell apart from true labor because these contractions can be forceful and people have different pain tolerances. You should not feel embarrassed if you go to the hospital with false labor. Sometimes, the only way to tell if you are in true labor is for your health care provider to look for changes in the cervix. If there are no prenatal problems or other health problems associated with the pregnancy, it is completely safe to be sent home with false labor and await the onset of true labor. HOW CAN YOU TELL THE DIFFERENCE BETWEEN TRUE AND FALSE LABOR? False Labor  The contractions of false labor are usually shorter and not as hard as those of true labor.   The contractions are usually irregular.   The contractions are often felt in the front of  the lower abdomen and in the groin.   The contractions may go away when you walk around or change positions while lying down.   The contractions get weaker and are shorter lasting as time goes on.   The contractions do not usually become progressively stronger, regular, and closer together as with true labor.  True Labor  Contractions in true labor last 30-70 seconds, become very regular, usually become more intense, and increase in frequency.   The contractions do not go away with walking.   The discomfort is usually felt in the top of the uterus and spreads to the lower abdomen and low back.   True labor can be determined by your health care provider with an exam. This will show that the cervix is dilating and getting thinner.  WHAT TO REMEMBER  Keep up with your usual exercises and follow other instructions given by your health care provider.   Take medicines as directed by your health care provider.   Keep your regular prenatal appointments.   Eat and drink lightly if you think you are going into labor.   If Braxton Hicks contractions are making you uncomfortable:   Change your position from lying down or resting to walking, or from walking to resting.   Sit and rest in a tub of warm water.   Drink 2-3 glasses of water. Dehydration may cause these contractions.   Do slow and deep breathing several times an hour.    WHEN SHOULD I SEEK IMMEDIATE MEDICAL CARE? Seek immediate medical care if:  Your contractions become stronger, more regular, and closer together.   You have fluid leaking or gushing from your vagina.   You have a fever.   You pass blood-tinged mucus.   You have vaginal bleeding.   You have continuous abdominal pain.   You have low back pain that you never had before.   You feel your baby's head pushing down and causing pelvic pressure.   Your baby is not moving as much as it used to.    This information is not intended to  replace advice given to you by your health care provider. Make sure you discuss any questions you have with your health care provider.   Document Released: 06/22/2005 Document Revised: 06/27/2013 Document Reviewed: 04/03/2013 Elsevier Interactive Patient Education 2016 [employer]  

## 2015-06-04 ENCOUNTER — Encounter: Payer: Self-pay | Admitting: Advanced Practice Midwife

## 2015-06-04 ENCOUNTER — Ambulatory Visit (INDEPENDENT_AMBULATORY_CARE_PROVIDER_SITE_OTHER): Payer: Medicaid Other | Admitting: Advanced Practice Midwife

## 2015-06-04 ENCOUNTER — Encounter: Payer: Medicaid Other | Admitting: Advanced Practice Midwife

## 2015-06-04 VITALS — BP 110/70 | HR 84 | Wt 167.5 lb

## 2015-06-04 DIAGNOSIS — O34219 Maternal care for unspecified type scar from previous cesarean delivery: Secondary | ICD-10-CM | POA: Diagnosis not present

## 2015-06-04 DIAGNOSIS — Z3493 Encounter for supervision of normal pregnancy, unspecified, third trimester: Secondary | ICD-10-CM | POA: Diagnosis not present

## 2015-06-04 DIAGNOSIS — Z1389 Encounter for screening for other disorder: Secondary | ICD-10-CM

## 2015-06-04 DIAGNOSIS — Z3A38 38 weeks gestation of pregnancy: Secondary | ICD-10-CM | POA: Diagnosis not present

## 2015-06-04 DIAGNOSIS — Z331 Pregnant state, incidental: Secondary | ICD-10-CM

## 2015-06-04 LAB — POCT URINALYSIS DIPSTICK
GLUCOSE UA: NEGATIVE
KETONES UA: NEGATIVE
Leukocytes, UA: NEGATIVE
NITRITE UA: NEGATIVE
Protein, UA: NEGATIVE

## 2015-06-04 NOTE — Patient Instructions (Signed)

## 2015-06-04 NOTE — Progress Notes (Signed)
[id number] Estimated Date of Delivery: 06/13/15  Blood pressure 110/70, pulse 84, weight 75.978 kg (167 lb 8 oz), last menstrual period 06/05/2014.   BP weight and urine results all reviewed and noted.  Please refer to the obstetrical flow sheet for the fundal height and fetal heart rate documentation:  Patient reports good fetal movement, denies any bleeding and no rupture of membranes symptoms or regular contractions. Patient is without complaints. All questions were answered.  Orders Placed This Encounter  Procedures  . POCT Urinalysis Dipstick    Plan:  Continued routine obstetrical care,   Return in about 1 week (around 06/11/2015) for LROB.

## 2015-06-06 ENCOUNTER — Inpatient Hospital Stay (HOSPITAL_COMMUNITY): Payer: Medicaid Other | Admitting: Anesthesiology

## 2015-06-06 ENCOUNTER — Inpatient Hospital Stay (HOSPITAL_COMMUNITY): Payer: Medicaid Other

## 2015-06-06 ENCOUNTER — Encounter (HOSPITAL_COMMUNITY): Payer: Self-pay | Admitting: *Deleted

## 2015-06-06 ENCOUNTER — Encounter (HOSPITAL_COMMUNITY)
Admission: AD | Disposition: A | Discharge status: Home / Self Care | Payer: Self-pay | Source: Ambulatory Visit | Attending: Obstetrics & Gynecology

## 2015-06-06 ENCOUNTER — Inpatient Hospital Stay (HOSPITAL_COMMUNITY)
Admission: AD | Admit: 2015-06-06 | Discharge: 2015-06-08 | DRG: 767 | Disposition: A | Discharge status: Home / Self Care | Payer: Medicaid Other | Source: Ambulatory Visit | Attending: Obstetrics & Gynecology | Admitting: Obstetrics & Gynecology

## 2015-06-06 ENCOUNTER — Encounter (HOSPITAL_COMMUNITY): Payer: Self-pay | Admitting: Family Medicine

## 2015-06-06 DIAGNOSIS — Z302 Encounter for sterilization: Secondary | ICD-10-CM

## 2015-06-06 DIAGNOSIS — O26893 Other specified pregnancy related conditions, third trimester: Secondary | ICD-10-CM | POA: Diagnosis present

## 2015-06-06 DIAGNOSIS — O0943 Supervision of pregnancy with grand multiparity, third trimester: Secondary | ICD-10-CM

## 2015-06-06 DIAGNOSIS — Z141 Cystic fibrosis carrier: Secondary | ICD-10-CM | POA: Diagnosis not present

## 2015-06-06 DIAGNOSIS — O99824 Streptococcus B carrier state complicating childbirth: Secondary | ICD-10-CM | POA: Diagnosis present

## 2015-06-06 DIAGNOSIS — O34219 Maternal care for unspecified type scar from previous cesarean delivery: Secondary | ICD-10-CM | POA: Diagnosis present

## 2015-06-06 DIAGNOSIS — Z3A39 39 weeks gestation of pregnancy: Secondary | ICD-10-CM

## 2015-06-06 DIAGNOSIS — Z3493 Encounter for supervision of normal pregnancy, unspecified, third trimester: Secondary | ICD-10-CM

## 2015-06-06 DIAGNOSIS — Z87891 Personal history of nicotine dependence: Secondary | ICD-10-CM

## 2015-06-06 DIAGNOSIS — O4290 Premature rupture of membranes, unspecified as to length of time between rupture and onset of labor, unspecified weeks of gestation: Secondary | ICD-10-CM | POA: Diagnosis present

## 2015-06-06 HISTORY — PX: TUBAL LIGATION: SHX77

## 2015-06-06 LAB — CBC
HCT: 37.9 % (ref 36.0–46.0)
HEMOGLOBIN: 12.7 g/dL (ref 12.0–15.0)
MCH: 29.9 pg (ref 26.0–34.0)
MCHC: 33.5 g/dL (ref 30.0–36.0)
MCV: 89.2 fL (ref 78.0–100.0)
Platelets: 265 10*3/uL (ref 150–400)
RBC: 4.25 MIL/uL (ref 3.87–5.11)
RDW: 14.3 % (ref 11.5–15.5)
WBC: 6.7 10*3/uL (ref 4.0–10.5)

## 2015-06-06 LAB — TYPE AND SCREEN
ABO/RH(D): O POS
Antibody Screen: NEGATIVE

## 2015-06-06 LAB — POCT FERN TEST: POCT Fern Test: POSITIVE

## 2015-06-06 SURGERY — LIGATION, FALLOPIAN TUBE, POSTPARTUM
Anesthesia: Epidural | Site: Abdomen | Laterality: Bilateral

## 2015-06-06 MED ORDER — OXYTOCIN 40 UNITS IN LACTATED RINGERS INFUSION - SIMPLE MED
62.5000 mL/h | INTRAVENOUS | Status: DC
  Filled 2015-06-06: qty 1000

## 2015-06-06 MED ORDER — DIPHENHYDRAMINE HCL 50 MG/ML IJ SOLN: 12.5000 mg | INTRAMUSCULAR | Status: DC | PRN

## 2015-06-06 MED ORDER — LANOLIN HYDROUS EX OINT: TOPICAL_OINTMENT | CUTANEOUS | Status: DC | PRN

## 2015-06-06 MED ORDER — DIBUCAINE 1 % RE OINT: 1.0000 "application " | TOPICAL_OINTMENT | RECTAL | Status: DC | PRN

## 2015-06-06 MED ORDER — DIPHENHYDRAMINE HCL 25 MG PO CAPS: 25.0000 mg | ORAL_CAPSULE | ORAL | Status: DC | PRN

## 2015-06-06 MED ORDER — FENTANYL CITRATE (PF) 100 MCG/2ML IJ SOLN
100.0000 ug | INTRAMUSCULAR | Status: DC | PRN
Start: 2015-06-06 — End: 2015-06-06
  Administered 2015-06-06: 100 ug via INTRAVENOUS
  Filled 2015-06-06: qty 2

## 2015-06-06 MED ORDER — LACTATED RINGERS IV SOLN: INTRAVENOUS | Status: DC | PRN

## 2015-06-06 MED ORDER — ONDANSETRON HCL 4 MG/2ML IJ SOLN: 4.0000 mg | Freq: Four times a day (QID) | INTRAMUSCULAR | Status: DC | PRN

## 2015-06-06 MED ORDER — PENICILLIN G POTASSIUM 5000000 UNITS IJ SOLR
2.5000 10*6.[IU] | INTRAVENOUS | Status: DC
  Filled 2015-06-06 (×2): qty 2.5

## 2015-06-06 MED ORDER — WITCH HAZEL-GLYCERIN EX PADS: 1.0000 "application " | MEDICATED_PAD | CUTANEOUS | Status: DC | PRN

## 2015-06-06 MED ORDER — LACTATED RINGERS IV SOLN
INTRAVENOUS | Status: DC
  Administered 2015-06-06: 06:00:00 via INTRAVENOUS

## 2015-06-06 MED ORDER — LACTATED RINGERS IV SOLN: INTRAVENOUS | Status: DC

## 2015-06-06 MED ORDER — OXYCODONE-ACETAMINOPHEN 5-325 MG PO TABS: 1.0000 | ORAL_TABLET | ORAL | Status: DC | PRN

## 2015-06-06 MED ORDER — SCOPOLAMINE 1 MG/3DAYS TD PT72
1.0000 | MEDICATED_PATCH | Freq: Once | TRANSDERMAL | Status: DC
  Filled 2015-06-06: qty 1

## 2015-06-06 MED ORDER — NALOXONE HCL 2 MG/2ML IJ SOSY
1.0000 ug/kg/h | PREFILLED_SYRINGE | INTRAVENOUS | Status: DC | PRN
  Filled 2015-06-06: qty 2

## 2015-06-06 MED ORDER — LIDOCAINE-EPINEPHRINE (PF) 2 %-1:200000 IJ SOLN
INTRAMUSCULAR | Status: DC | PRN
  Administered 2015-06-06 (×3): 5 mL via EPIDURAL

## 2015-06-06 MED ORDER — TETANUS-DIPHTH-ACELL PERTUSSIS 5-2.5-18.5 LF-MCG/0.5 IM SUSP: 0.5000 mL | Freq: Once | INTRAMUSCULAR | Status: DC

## 2015-06-06 MED ORDER — KETOROLAC TROMETHAMINE 30 MG/ML IJ SOLN: 30.0000 mg | Freq: Four times a day (QID) | INTRAMUSCULAR | Status: AC | PRN

## 2015-06-06 MED ORDER — MEPERIDINE HCL 25 MG/ML IJ SOLN: 6.2500 mg | INTRAMUSCULAR | Status: DC | PRN

## 2015-06-06 MED ORDER — LIDOCAINE HCL (CARDIAC) 10 MG/ML IV SOLN
INTRAVENOUS | Status: DC | PRN
  Administered 2015-06-06: 30 mg via INTRAVENOUS

## 2015-06-06 MED ORDER — PRENATAL MULTIVITAMIN CH
1.0000 | ORAL_TABLET | Freq: Every day | ORAL | Status: DC
  Administered 2015-06-07: 1 via ORAL
  Filled 2015-06-06: qty 1

## 2015-06-06 MED ORDER — NALBUPHINE HCL 10 MG/ML IJ SOLN: 5.0000 mg | Freq: Once | INTRAMUSCULAR | Status: DC | PRN

## 2015-06-06 MED ORDER — ONDANSETRON HCL 4 MG PO TABS: 4.0000 mg | ORAL_TABLET | ORAL | Status: DC | PRN

## 2015-06-06 MED ORDER — LIDOCAINE HCL (PF) 1 % IJ SOLN
30.0000 mL | INTRAMUSCULAR | Status: DC | PRN
  Filled 2015-06-06: qty 30

## 2015-06-06 MED ORDER — OXYCODONE-ACETAMINOPHEN 5-325 MG PO TABS: 2.0000 | ORAL_TABLET | ORAL | Status: DC | PRN

## 2015-06-06 MED ORDER — LIDOCAINE HCL (PF) 1 % IJ SOLN
INTRAMUSCULAR | Status: DC | PRN
  Administered 2015-06-06 (×2): 4 mL via EPIDURAL

## 2015-06-06 MED ORDER — PROMETHAZINE HCL 25 MG/ML IJ SOLN: 6.2500 mg | INTRAMUSCULAR | Status: DC | PRN

## 2015-06-06 MED ORDER — MIDAZOLAM HCL 2 MG/2ML IJ SOLN
INTRAMUSCULAR | Status: AC
  Filled 2015-06-06: qty 2

## 2015-06-06 MED ORDER — ZOLPIDEM TARTRATE 5 MG PO TABS: 5.0000 mg | ORAL_TABLET | Freq: Every evening | ORAL | Status: DC | PRN

## 2015-06-06 MED ORDER — OXYTOCIN BOLUS FROM INFUSION: 500.0000 mL | INTRAVENOUS | Status: DC

## 2015-06-06 MED ORDER — ACETAMINOPHEN 325 MG PO TABS
650.0000 mg | ORAL_TABLET | ORAL | Status: DC | PRN
Start: 2015-06-06 — End: 2015-06-06

## 2015-06-06 MED ORDER — NALOXONE HCL 0.4 MG/ML IJ SOLN: 0.4000 mg | INTRAMUSCULAR | Status: DC | PRN

## 2015-06-06 MED ORDER — BENZOCAINE-MENTHOL 20-0.5 % EX AERO: 1.0000 "application " | INHALATION_SPRAY | CUTANEOUS | Status: DC | PRN

## 2015-06-06 MED ORDER — CITRIC ACID-SODIUM CITRATE 334-500 MG/5ML PO SOLN
30.0000 mL | ORAL | Status: DC | PRN
  Administered 2015-06-06: 30 mL via ORAL
  Filled 2015-06-06: qty 15

## 2015-06-06 MED ORDER — NALBUPHINE HCL 10 MG/ML IJ SOLN: 5.0000 mg | INTRAMUSCULAR | Status: DC | PRN

## 2015-06-06 MED ORDER — ACETAMINOPHEN 325 MG PO TABS: 650.0000 mg | ORAL_TABLET | ORAL | Status: DC | PRN

## 2015-06-06 MED ORDER — BUPIVACAINE HCL (PF) 0.25 % IJ SOLN
INTRAMUSCULAR | Status: DC | PRN
  Administered 2015-06-06: 10 mL

## 2015-06-06 MED ORDER — EPHEDRINE 5 MG/ML INJ: 10.0000 mg | INTRAVENOUS | Status: DC | PRN

## 2015-06-06 MED ORDER — BUPIVACAINE HCL (PF) 0.25 % IJ SOLN
INTRAMUSCULAR | Status: AC
  Filled 2015-06-06: qty 30

## 2015-06-06 MED ORDER — IBUPROFEN 600 MG PO TABS
600.0000 mg | ORAL_TABLET | Freq: Four times a day (QID) | ORAL | Status: DC
  Administered 2015-06-06 – 2015-06-08 (×7): 600 mg via ORAL
  Filled 2015-06-06 (×7): qty 1

## 2015-06-06 MED ORDER — DIPHENHYDRAMINE HCL 25 MG PO CAPS: 25.0000 mg | ORAL_CAPSULE | Freq: Four times a day (QID) | ORAL | Status: DC | PRN

## 2015-06-06 MED ORDER — SENNOSIDES-DOCUSATE SODIUM 8.6-50 MG PO TABS
2.0000 | ORAL_TABLET | ORAL | Status: DC
  Administered 2015-06-07 – 2015-06-08 (×2): 2 via ORAL
  Filled 2015-06-06 (×2): qty 2

## 2015-06-06 MED ORDER — ONDANSETRON HCL 4 MG/2ML IJ SOLN: 4.0000 mg | Freq: Three times a day (TID) | INTRAMUSCULAR | Status: DC | PRN

## 2015-06-06 MED ORDER — PENICILLIN G POTASSIUM 5000000 UNITS IJ SOLR
5.0000 10*6.[IU] | Freq: Once | INTRAVENOUS | Status: AC
  Administered 2015-06-06: 5 10*6.[IU] via INTRAVENOUS
  Filled 2015-06-06: qty 5

## 2015-06-06 MED ORDER — LACTATED RINGERS IV SOLN
500.0000 mL | INTRAVENOUS | Status: DC | PRN
  Administered 2015-06-06: 500 mL via INTRAVENOUS

## 2015-06-06 MED ORDER — FENTANYL 2.5 MCG/ML BUPIVACAINE 1/10 % EPIDURAL INFUSION (WH - ANES)
14.0000 mL/h | INTRAMUSCULAR | Status: DC | PRN
  Administered 2015-06-06: 14 mL/h via EPIDURAL
  Administered 2015-06-06: 12 mL/h via EPIDURAL
  Filled 2015-06-06: qty 125

## 2015-06-06 MED ORDER — ONDANSETRON HCL 4 MG/2ML IJ SOLN: 4.0000 mg | INTRAMUSCULAR | Status: DC | PRN

## 2015-06-06 MED ORDER — PHENYLEPHRINE 40 MCG/ML (10ML) SYRINGE FOR IV PUSH (FOR BLOOD PRESSURE SUPPORT)
80.0000 ug | PREFILLED_SYRINGE | INTRAVENOUS | Status: DC | PRN
  Filled 2015-06-06: qty 20

## 2015-06-06 MED ORDER — SODIUM CHLORIDE 0.9 % IJ SOLN: 3.0000 mL | INTRAMUSCULAR | Status: DC | PRN

## 2015-06-06 MED ORDER — MIDAZOLAM HCL 5 MG/5ML IJ SOLN
INTRAMUSCULAR | Status: DC | PRN
  Administered 2015-06-06: 1 mg via INTRAVENOUS

## 2015-06-06 MED ORDER — SIMETHICONE 80 MG PO CHEW: 80.0000 mg | CHEWABLE_TABLET | ORAL | Status: DC | PRN

## 2015-06-06 MED ORDER — LACTATED RINGERS IV SOLN
INTRAVENOUS | Status: DC | PRN
  Administered 2015-06-06: 13:00:00 via INTRAVENOUS

## 2015-06-06 SURGICAL SUPPLY — 19 items
CHLORAPREP W/TINT 26ML (MISCELLANEOUS) ×3 IMPLANT
CLIP FILSHIE TUBAL LIGA STRL (Clip) ×3 IMPLANT
CLOTH BEACON ORANGE TIMEOUT ST (SAFETY) ×3 IMPLANT
DRSG OPSITE POSTOP 3X4 (GAUZE/BANDAGES/DRESSINGS) ×3 IMPLANT
GLOVE BIOGEL PI IND STRL 7.0 (GLOVE) ×2 IMPLANT
GLOVE BIOGEL PI INDICATOR 7.0 (GLOVE) ×4
GLOVE ECLIPSE 7.0 STRL STRAW (GLOVE) ×6 IMPLANT
GOWN STRL REUS W/TWL LRG LVL3 (GOWN DISPOSABLE) ×6 IMPLANT
NEEDLE HYPO 22GX1.5 SAFETY (NEEDLE) ×3 IMPLANT
NS IRRIG 1000ML POUR BTL (IV SOLUTION) ×3 IMPLANT
PACK ABDOMINAL MINOR (CUSTOM PROCEDURE TRAY) ×3 IMPLANT
SPONGE LAP 4X18 X RAY DECT (DISPOSABLE) IMPLANT
SUT VIC AB 0 CT1 27 (SUTURE) ×3
SUT VIC AB 0 CT1 27XBRD ANBCTR (SUTURE) ×1 IMPLANT
SUT VICRYL 4-0 PS2 18IN ABS (SUTURE) ×3 IMPLANT
SYR CONTROL 10ML LL (SYRINGE) ×3 IMPLANT
TOWEL OR 17X24 6PK STRL BLUE (TOWEL DISPOSABLE) ×6 IMPLANT
TRAY FOLEY CATH SILVER 14FR (SET/KITS/TRAYS/PACK) ×3 IMPLANT
WATER STERILE IRR 1000ML POUR (IV SOLUTION) ×3 IMPLANT

## 2015-06-06 NOTE — Transfer of Care (Signed)
Immediate Anesthesia Transfer of Care Note  Patient: Kelli RiasShameka L G[place]borough  Procedure(s) Performed: Procedure(s): POST PARTUM TUBAL LIGATION (Bilateral)  Patient Location: PACU  Anesthesia Type:Epidural  Level of Consciousness: awake  Airway & Oxygen Therapy: Patient Spontanous Breathing  Post-op Assessment: Report given to RN and Post -op Vital signs reviewed and stable  Post vital signs: Reviewed and stable  Last Vitals:  Filed Vitals:   06/06/15 1045 06/06/15 1225  BP: 109/58 99/60  Pulse: 81 89  Temp: 36.8 C 37.3 C  Resp: 16 16    Complications: No apparent anesthesia complications

## 2015-06-06 NOTE — H&P (Signed)
Kelli Kennedy is a 34 y.o. female  [phone] @ 39.0wks by 7wk scan presenting for eval of leaking fluid since 0400 w/ onset of stronger ctx since then. Reports +FM. No N/V/D. 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 Pregnant 10/17/2014  . Medical history non-contributory    Past Surgical History  Procedure Laterality Date  . Cesarean section    . Dilation and curettage of uterus    . Induced abortion  06/2014   Family History: family history is not on file. Social History:  reports that she has quit smoking. Her smoking use included Cigarettes. She has never used smokeless tobacco. She reports that she does not drink alcohol or use illicit drugs.   Prenatal Transfer Tool  Maternal Diabetes: No Genetic Screening: Normal Maternal Ultrasounds/Referrals: Normal Fetal Ultrasounds or other Referrals:  None Maternal Substance Abuse:  No Significant Maternal Medications:  None Significant Maternal Lab Results:  Lab values include: Group B Strep positive Other Comments:  None  ROS  Dilation: 1 Effacement (%): 70 Station: -2 Exam by:: Philipp DeputyKim Shaw, cnm Blood pressure 125/80, pulse 100, temperature 98.7 F (37.1 C), temperature source Oral, resp. rate 18, last menstrual period 06/05/2014. Exam Physical Exam  Constitutional: She is oriented to person, place, and time. She appears well-developed.  HENT:  Head: Normocephalic.  Neck: Normal range of motion.  Cardiovascular: Normal rate.   Respiratory: Effort normal.  GI:  EFM 140s, +accels, no decels Ctx irreg q 2-5 mins  Genitourinary:  Cx 1/70/-2; +leaking, +fern  Musculoskeletal: Normal range of motion.  Neurological: She is alert  and oriented to person, place, and time.  Skin: Skin is warm and dry.  Psychiatric: She has a normal mood and affect. Her behavior is normal. Thought content normal.    Prenatal labs: ABO, Rh: O/Positive/-- (05/03 1202) Antibody: Negative (09/09 0904) Rubella: 1.00 (05/03 1202) RPR: Non Reactive (09/09 0904)  HBsAg: Negative (05/03 1202)  HIV: Non Reactive (09/09 0904)  GBS:     Assessment/Plan: IUP@39 .0wks TOLAC SROM/Possible early labor GBS pos  Admit to [facility] for GBS ppx Expectant management Anticipate SVD   Cam HaiSHAW, KIMBERLY CNM 06/06/2015, 5:26 AM

## 2015-06-06 NOTE — MAU Note (Signed)
Pt states water broke at 0400-clear fluid. Contracting every 8-9 mins. +FM 1cm in office on Tuesday. GBS+

## 2015-06-06 NOTE — Op Note (Signed)
Kelli RiasShameka L Kennedy  06/06/2015  PREOPERATIVE DIAGNOSIS:  Multiparity, undesired fertility  POSTOPERATIVE DIAGNOSIS:  Multiparity, undesired fertility  PROCEDURE:  Postpartum Bilateral Tubal Sterilization using Filshie Clips   ANESTHESIA:  Epidural and local analgesia using 0.25% Marcaine  COMPLICATIONS:  None immediate.  ESTIMATED BLOOD LOSS: 5 ml.  INDICATIONS: 34 y.o. [id number]  with undesired fertility,status post vaginal delivery, desires permanent sterilization.  Other reversible forms of contraception were discussed with patient; she declines all other modalities. Risks of procedure discussed with patient including but not limited to: risk of regret, permanence of method, bleeding, infection, injury to surrounding organs and need for additional procedures.  Failure risk of 0.5-1% with increased risk of ectopic gestation if pregnancy occurs was also discussed with patient.     FINDINGS:  Normal uterus, tubes, and ovaries.  PROCEDURE DETAILS: The patient was taken to the operating room where her spinal anesthesia was dosed up to surgical level and found to be adequate.  She was then placed in a supine position and prepped and draped in the usual sterile fashion.  After an adequate timeout was performed, attention was turned to the patient's abdomen where a small transverse skin incision was made under the umbilical fold. The incision was taken down to the layer of fascia using the scalpel, and fascia was incised, and extended bilaterally. The peritoneum was entered in a sharp fashion. The patient was placed in Trendelenburg.  The left fallopian tube was identified and grasped with a Babcock clamp, and followed out to the fimbriated end.  A Filshie clip was placed on the left fallopian tube about 2 cm from the cornu.  A similar process was carried out on the right side allowing for bilateral tubal sterilization.  Good hemostasis was noted overall.  Local analgesia was injected into both Filshie  application sites.The instruments were then removed from the patient's abdomen and the fascial incision was repaired with 0 Vicryl, and the skin was closed with a 4-0 Vicryl subcuticular stitch. The patient tolerated the procedure well.  Sponge, lap, and needle counts were correct times two.  The patient was then taken to the recovery room awake, extubated and in stable condition.  Nakesha Ebrahim S MD 06/06/2015 1:54 PM

## 2015-06-06 NOTE — Interval H&P Note (Signed)
History and Physical Interval Note:  06/06/2015 1:10 PM  Kelli Kennedy  has presented today for surgery, with the diagnosis of desries sterilization, she is now s/p VBAC at term.  The various methods of treatment have been discussed with the patient and family. After consideration of risks, benefits and other options for treatment, the patient has consented to  Procedure(s): POST PARTUM TUBAL LIGATION (Bilateral) as a surgical intervention.  The patient's history has been reviewed, patient examined, no change in status, stable for surgery.  I have reviewed the patient's chart and labs.  Questions were answered to the patient's satisfaction.  Patient counseled, r.e. Risks benefits of BTL, including permanency of procedure, risk of failure(1:100), increased risk of ectopic.  Patient verbalized understanding and desires to proceed    Chisom Aust S

## 2015-06-06 NOTE — Anesthesia Postprocedure Evaluation (Signed)
Anesthesia Post Note  Patient: Kelli Kennedy  Procedure(s) Performed: Procedure(s) (LRB): POST PARTUM TUBAL LIGATION (Bilateral)  Patient location during evaluation: PACU Anesthesia Type: Epidural Level of consciousness: oriented and awake and alert Pain management: pain level controlled Vital Signs Assessment: post-procedure vital signs reviewed and stable Respiratory status: spontaneous breathing, respiratory function stable and patient connected to nasal cannula oxygen Cardiovascular status: blood pressure returned to baseline and stable Postop Assessment: no headache, no backache and epidural receding Anesthetic complications: no    Last Vitals:  Filed Vitals:   06/06/15 1448 06/06/15 1500  BP:    Pulse: 87 107  Temp:    Resp: 18 17    Last Pain:  Filed Vitals:   06/06/15 1509  PainSc: 0-No pain    LLE Motor Response: No movement due to regional block, Purposeful movement LLE Sensation: Tingling RLE Motor Response: Purposeful movement RLE Sensation: Tingling      Shelton SilvasKevin D Martavion Couper

## 2015-06-06 NOTE — Anesthesia Procedure Notes (Addendum)
Epidural Patient location during procedure: OB Start time: 06/06/2015 7:43 AM End time: 06/06/2015 7:49 AM  Staffing Anesthesiologist: Shona SimpsonHOLLIS, KEVIN D Performed by: anesthesiologist   Preanesthetic Checklist Completed: patient identified, site marked, surgical consent, pre-op evaluation, timeout performed, IV checked, risks and benefits discussed and monitors and equipment checked  Epidural Patient position: sitting Prep: Betadine Patient monitoring: heart rate, continuous pulse ox and blood pressure Approach: midline Location: L4-L5 Injection technique: LOR saline  Needle:  Needle type: Tuohy  Needle gauge: 18 G Needle length: 9 cm and 9 Catheter type: closed end flexible Catheter size: 20 Guage Test dose: negative and Other  Assessment Events: blood not aspirated, injection not painful, no injection resistance, negative IV test and no paresthesia  Additional Notes LOR @ 6  Patient identified. Risks/Benefits/Options discussed with patient including but not limited to bleeding, infection, nerve damage, paralysis, failed block, incomplete pain control, headache, blood pressure changes, nausea, vomiting, reactions to medications, itching and postpartum back pain. Confirmed with bedside nurse the patient's most recent platelet count. Confirmed with patient that they are not currently taking any anticoagulation, have any bleeding history or any family history of bleeding disorders. Patient expressed understanding and wished to proceed. All questions were answered. Sterile technique was used throughout the entire procedure. Please see nursing notes for vital signs. Test dose was given through epidural catheter and negative prior to continuing to dose epidural or start infusion. Warning signs of high block given to the patient including shortness of breath, tingling/numbness in hands, complete motor block, or any concerning symptoms with instructions to call for help. Patient was given  instructions on fall risk and not to get out of bed. All questions and concerns addressed with instructions to call with any issues or inadequate analgesia.      Patient tolerated the insertion well without complications.Reason for block:procedure for pain

## 2015-06-06 NOTE — Anesthesia Preprocedure Evaluation (Signed)
Anesthesia Evaluation  Patient identified by MRN, date of birth, ID band Patient awake    Reviewed: Allergy & Precautions, NPO status , Patient's Chart, lab work & pertinent test results  Airway Mallampati: II  TM Distance: >3 FB Neck ROM: Full    Dental  (+) Teeth Intact   Pulmonary former smoker,    breath sounds clear to auscultation       Cardiovascular negative cardio ROS   Rhythm:Regular Rate:Normal     Neuro/Psych negative neurological ROS  negative psych ROS   GI/Hepatic negative GI ROS, Neg liver ROS,   Endo/Other  negative endocrine ROS  Renal/GU negative Renal ROS     Musculoskeletal negative musculoskeletal ROS (+)   Abdominal   Peds negative pediatric ROS (+)  Hematology negative hematology ROS (+)   Anesthesia Other Findings   Reproductive/Obstetrics negative OB ROS                             Lab Results  Component Value Date   WBC 6.7 06/06/2015   HGB 12.7 06/06/2015   HCT 37.9 06/06/2015   MCV 89.2 06/06/2015   PLT 265 06/06/2015   Lab Results  Component Value Date   CREATININE 0.92 09/11/2010   BUN 10 09/11/2010   NA 138 09/11/2010   K 3.4* 09/11/2010   CL 105 09/11/2010   CO2 25 09/11/2010   No results found for: INR, PROTIME   Anesthesia Physical Anesthesia Plan  ASA: II  Anesthesia Plan: Epidural   Post-op Pain Management:    Induction:   Airway Management Planned: Natural Airway  Additional Equipment:   Intra-op Plan:   Post-operative Plan:   Informed Consent: I have reviewed the patients History and Physical, chart, labs and discussed the procedure including the risks, benefits and alternatives for the proposed anesthesia with the patient or authorized representative who has indicated his/her understanding and acceptance.   Dental advisory given  Plan Discussed with: CRNA  Anesthesia Plan Comments:         Anesthesia Quick  Evaluation

## 2015-06-06 NOTE — Anesthesia Preprocedure Evaluation (Addendum)
Anesthesia Evaluation  Patient identified by MRN, date of birth, ID band Patient awake    Reviewed: Allergy & Precautions, NPO status , Patient's Chart, lab work & pertinent test results  Airway Mallampati: II       Dental  (+) Teeth Intact   Pulmonary former smoker,    breath sounds clear to auscultation       Cardiovascular negative cardio ROS   Rhythm:Regular Rate:Normal     Neuro/Psych negative neurological ROS  negative psych ROS   GI/Hepatic negative GI ROS, Neg liver ROS,   Endo/Other  negative endocrine ROS  Renal/GU negative Renal ROS  negative genitourinary   Musculoskeletal negative musculoskeletal ROS (+)   Abdominal   Peds negative pediatric ROS (+)  Hematology negative hematology ROS (+)   Anesthesia Other Findings   Reproductive/Obstetrics (+) Pregnancy                            Lab Results  Component Value Date   WBC 6.7 06/06/2015   HGB 12.7 06/06/2015   HCT 37.9 06/06/2015   MCV 89.2 06/06/2015   PLT 265 06/06/2015   No results found for: INR, PROTIME   Anesthesia Physical Anesthesia Plan  ASA: II  Anesthesia Plan: Epidural   Post-op Pain Management:    Induction:   Airway Management Planned:   Additional Equipment:   Intra-op Plan:   Post-operative Plan:   Informed Consent: I have reviewed the patients History and Physical, chart, labs and discussed the procedure including the risks, benefits and alternatives for the proposed anesthesia with the patient or authorized representative who has indicated his/her understanding and acceptance.     Plan Discussed with:   Anesthesia Plan Comments:         Anesthesia Quick Evaluation

## 2015-06-07 ENCOUNTER — Encounter (HOSPITAL_COMMUNITY): Payer: Self-pay | Admitting: Family Medicine

## 2015-06-07 LAB — RPR: RPR: NONREACTIVE

## 2015-06-07 NOTE — Progress Notes (Signed)
Patient has changed to just formula

## 2015-06-07 NOTE — Addendum Note (Signed)
Addendum  created 06/07/15 [id number] by Junious SilkMelinda Saumya Hukill, CRNA   Modules edited: Clinical Notes   Clinical Notes:  File: [id number]

## 2015-06-07 NOTE — Progress Notes (Signed)
Post Partum Day 1 Subjective: no complaints, up ad lib, voiding and tolerating PO, small lochia, plans to breastfeed, plans to bottle feed, plans postpartum salpingectomy,   Objective: Blood pressure 103/63, pulse 87, temperature 98 F (36.7 C), temperature source Oral, resp. rate 20, height 5\' 1"  (1.549 m), weight 76.204 kg (168 lb), last menstrual period 06/05/2014, SpO2 100 %, unknown if currently breastfeeding.  Physical Exam:  General: alert, cooperative and no distress Lochia:normal flow Chest: CTAB Heart: RRR no m/r/g Abdomen: +BS, soft, nontender,  Uterine Fundus: firm DVT Evaluation: No evidence of DVT seen on physical exam. Extremities: trace edema   Recent Labs  06/06/15 0540  HGB 12.7  HCT 37.9    Assessment/Plan: Plan for discharge tomorrow   LOS: 1 day   CRESENZO-DISHMAN,Kelli Kennedy 06/07/2015, 8:44 AM

## 2015-06-07 NOTE — Anesthesia Postprocedure Evaluation (Signed)
Anesthesia Post Note  Patient: Kelli Kennedy  Procedure(s) Performed: Procedure(s) (LRB): POST PARTUM TUBAL LIGATION (Bilateral)  Anesthesia Type: Epidural Level of consciousness: awake, awake and alert and oriented Pain management: pain level controlled Vital Signs Assessment: post-procedure vital signs reviewed and stable Respiratory status: spontaneous breathing, nonlabored ventilation and respiratory function stable Cardiovascular status: blood pressure returned to baseline Postop Assessment: no headache, no backache, patient able to bend at knees, no signs of nausea or vomiting and adequate PO intake Anesthetic complications: no    Last Vitals:  Filed Vitals:   06/07/15 0544 06/07/15 0745  BP: 99/64 103/63  Pulse: 93 87  Temp: 36.7 C 36.7 C  Resp: 19 20    Last Pain:  Filed Vitals:   06/07/15 0907  PainSc: 0-No pain    LLE Motor Response: Purposeful movement LLE Sensation: Tingling RLE Motor Response: Purposeful movement RLE Sensation: Tingling      Onis Markoff

## 2015-06-08 MED ORDER — IBUPROFEN 600 MG PO TABS: 600.0000 mg | ORAL_TABLET | Freq: Four times a day (QID) | ORAL | Status: DC | PRN

## 2015-06-08 MED ORDER — OXYCODONE-ACETAMINOPHEN 5-325 MG PO TABS: 1.0000 | ORAL_TABLET | ORAL | Status: AC | PRN

## 2015-06-08 NOTE — Discharge Summary (Signed)
OB Discharge Summary     Patient Name: Kelli Kennedy DOB: [birth year] MRN: [id number]  Date of admission: 06/06/2015 Delivering MD: Lyndel Safe NILES   Date of discharge: 06/08/2015  Admitting diagnosis: 37 WEEKS CTX desries sterilization Intrauterine pregnancy: [redacted]w[redacted]d     Secondary diagnosis:  Active Problems:   Amniotic fluid leaking  Additional problems: prev C/S- desired VBAC     Discharge diagnosis: VBAC                                                                                                Post partum procedures:postpartum tubal ligation  Augmentation: none  Complications: None  Hospital course:  Onset of Labor With Vaginal Delivery     34 y.o. yo [id number] at [redacted]w[redacted]d was admitted in Latent Laboron 06/06/2015. Patient had an uncomplicated labor course as follows:  Membrane Rupture Time/Date: 3:50 AM ,06/06/2015   Intrapartum Procedures: Episiotomy: None [1]                                         Lacerations:  None [1]  Patient had a delivery of a Viable infant. 06/06/2015  Information for the patient's newborn:  Sumiye, Hirth [[id number]]  Delivery Method: VBAC, Spontaneous (Filed from Delivery Summary)    Pateint had an uncomplicated postpartum course.  She is ambulating, tolerating a regular diet, passing flatus, and urinating well. Patient is discharged home in stable condition on 06/08/2015 12:35 PM.    Physical exam  Filed Vitals:   06/07/15 0745 06/07/15 1145 06/07/15 1747 06/08/15 0529  BP: 103/63 105/60 97/66 106/65  Pulse: 87 95 94 88  Temp: 98 F (36.7 C) 97.8 F (36.6 C) 98.4 F (36.9 C) 97.5 F (36.4 C)  TempSrc: Oral Oral Oral Oral  Resp: Height:      Weight:      SpO2: 100%      General: alert and cooperative Lochia: appropriate Uterine Fundus: firm Incision: N/A DVT Evaluation: No evidence of DVT seen on physical exam. Labs: Lab Results  Component Value Date   WBC 6.7 06/06/2015   HGB 12.7 06/06/2015   HCT 37.9 06/06/2015   MCV 89.2 06/06/2015   PLT 265 06/06/2015   CMP Latest Ref Rng 09/11/2010  Glucose 70 - 99 mg/dL 98  BUN 6 - 23 mg/dL 10  Creatinine 0.4 - 1.2 mg/dL 1.30  Sodium 865 - 784 mEq/L 138  Potassium 3.5 - 5.1 mEq/L 3.4(L)  Chloride 96 - 112 mEq/L 105  CO2 19 - 32 mEq/L 25  Calcium 8.4 - 10.5 mg/dL 9.4  Total Protein 6.0 - 8.3 g/dL -  Total Bilirubin 0.3 - 1.2 mg/dL -  Alkaline Phos 39 - 696 U/L -  AST 0 - 37 U/L -  ALT 0 - 35 U/L -    Discharge instruction: per After Visit Summary and "Baby and Me Booklet".  After visit meds:    Medication List    TAKE these medications  ibuprofen 600 MG tablet  Commonly known as:  ADVIL,MOTRIN  Take 1 tablet (600 mg total) by mouth every 6 (six) hours as needed.     oxyCODONE-acetaminophen 5-325 MG tablet  Commonly known as:  ROXICET  Take 1 tablet by mouth every 4 (four) hours as needed for severe pain.     prenatal vitamin w/FE, FA 27-1 MG Tabs tablet  Take 1 tablet by mouth daily at 12 noon.        Diet: routine diet  Activity: Advance as tolerated. Pelvic rest for 6 weeks.   Outpatient follow up:6 weeks Follow up Appt:No future appointments. Follow up Visit:No Follow-up on file.  Postpartum contraception: Tubal Ligation  Newborn Data: Live born female  Birth Weight: 6 lb 7.4 oz (2931 g) APGAR: 8, 9  Baby Feeding: Bottle Disposition:home with mother   06/08/2015 Cam HaiSHAW, Shakil Dirk, CNM

## 2015-06-08 NOTE — Discharge Instructions (Signed)
Laparoscopic Tubal Ligation, Care After Refer to this sheet in the next few weeks. These instructions provide you with information about caring for yourself after your procedure. Your health care provider may also give you more specific instructions. Your treatment has been planned according to current medical practices, but problems sometimes occur. Call your health care provider if you have any problems or questions after your procedure. WHAT TO EXPECT AFTER THE PROCEDURE After your procedure, it is common to have:  Sore throat.  Soreness at the incision site.  Mild cramping.  Tiredness.  Mild nausea or vomiting.  Shoulder pain. HOME CARE INSTRUCTIONS  Rest for the remainder of the day.  Take medicines only as directed by your health care provider. These include over-the-counter medicines and prescription medicines. Do not take aspirin, which can cause bleeding.  Over the next few days, gradually return to your normal activities and your normal diet.  Avoid sexual intercourse for 2 weeks or as directed by your health care provider.  Do not use tampons, and do not douche.  Do not drive or operate heavy machinery while taking pain medicine.  Do not lift anything that is heavier than 5 lb (2.3 kg) for 2 weeks or as directed by your health care provider.  Do not take baths. Take showers only. Ask your health care provider when you can start taking baths.  Take your temperature twice each day and write it down.  Try to have help for your household needs for the first 7-10 days.  There are many different ways to close and cover an incision, including stitches (sutures), skin glue, and adhesive strips. Follow instructions from your health care provider about:  Incision care.  Bandage (dressing) changes and removal.  Incision closure removal.  Check your incision area every day for signs of infection. Watch for:  Redness, swelling, or pain.  Fluid, blood, or pus.  Keep  all follow-up visits as directed by your health care provider. SEEK MEDICAL CARE IF:  You have redness, swelling, or increasing pain in your incision area.  You have fluid, blood, or pus coming from your incision for longer than 1 day.  You notice a bad smell coming from your incision or your dressing.  The edges of your incision break open after the sutures have been removed.  Your pain does not decrease after 2-3 days.  You have a rash.  You repeatedly become dizzy or light-headed.  You have a reaction to your medicine.  Your pain medicine is not helping.  You are constipated. SEEK IMMEDIATE MEDICAL CARE IF:  You have a fever.  You faint.  You have increasing pain in your abdomen.  You have severe pain in one or both of your shoulders.  You have bleeding or drainage from your suture sites or your vagina after surgery.  You have shortness of breath or have difficulty breathing.  You have chest pain or leg pain.  You have ongoing nausea, vomiting, or diarrhea.   This information is not intended to replace advice given to you by your health care provider. Make sure you discuss any questions you have with your health care provider.   Document Released: 01/09/2005 Document Revised: 11/06/2014 Document Reviewed: 10/03/2011 Elsevier Interactive Patient Education 2016 Elsevier Inc. Postpartum Care After Vaginal Delivery After you deliver your newborn (postpartum period), the usual stay in the hospital is 24-72 hours. If there were problems with your labor or delivery, or if you have other medical problems, you might be in  the hospital longer.  While you are in the hospital, you will receive help and instructions on how to care for yourself and your newborn during the postpartum period.  While you are in the hospital:  Be sure to tell your nurses if you have pain or discomfort, as well as where you feel the pain and what makes the pain worse.  If you had an incision made  near your vagina (episiotomy) or if you had some tearing during delivery, the nurses may put ice packs on your episiotomy or tear. The ice packs may help to reduce the pain and swelling.  If you are breastfeeding, you may feel uncomfortable contractions of your uterus for a couple of weeks. This is normal. The contractions help your uterus get back to normal size.  It is normal to have some bleeding after delivery.  For the first 1-3 days after delivery, the flow is red and the amount may be similar to a period.  It is common for the flow to start and stop.  In the first few days, you may pass some small clots. Let your nurses know if you begin to pass large clots or your flow increases.  Do not  flush blood clots down the toilet before having the nurse look at them.  During the next 3-10 days after delivery, your flow should become more watery and pink or brown-tinged in color.  Ten to fourteen days after delivery, your flow should be a small amount of yellowish-white discharge.  The amount of your flow will decrease over the first few weeks after delivery. Your flow may stop in 6-8 weeks. Most women have had their flow stop by 12 weeks after delivery.  You should change your sanitary pads frequently.  Wash your hands thoroughly with soap and water for at least 20 seconds after changing pads, using the toilet, or before holding or feeding your newborn.  You should feel like you need to empty your bladder within the first 6-8 hours after delivery.  In case you become weak, lightheaded, or faint, call your nurse before you get out of bed for the first time and before you take a shower for the first time.  Within the first few days after delivery, your breasts may begin to feel tender and full. This is called engorgement. Breast tenderness usually goes away within 48-72 hours after engorgement occurs. You may also notice milk leaking from your breasts. If you are not breastfeeding, do not  stimulate your breasts. Breast stimulation can make your breasts produce more milk.  Spending as much time as possible with your newborn is very important. During this time, you and your newborn can feel close and get to know each other. Having your newborn stay in your room (rooming in) will help to strengthen the bond with your newborn. It will give you time to get to know your newborn and become comfortable caring for your newborn.  Your hormones change after delivery. Sometimes the hormone changes can temporarily cause you to feel sad or tearful. These feelings should not last more than a few days. If these feelings last longer than that, you should talk to your caregiver.  If desired, talk to your caregiver about methods of family planning or contraception.  Talk to your caregiver about immunizations. Your caregiver may want you to have the following immunizations before leaving the hospital:  Tetanus, diphtheria, and pertussis (Tdap) or tetanus and diphtheria (Td) immunization. It is very important that you and  your family (including grandparents) or others caring for your newborn are up-to-date with the Tdap or Td immunizations. The Tdap or Td immunization can help protect your newborn from getting ill.  Rubella immunization.  Varicella (chickenpox) immunization.  Influenza immunization. You should receive this annual immunization if you did not receive the immunization during your pregnancy.   This information is not intended to replace advice given to you by your health care provider. Make sure you discuss any questions you have with your health care provider.   Document Released: 04/19/2007 Document Revised: 03/16/2012 Document Reviewed: 02/17/2012 Elsevier Interactive Patient Education [employer]2016 Elsevier Inc.

## 2015-06-11 ENCOUNTER — Encounter: Payer: Medicaid Other | Admitting: Women's Health

## 2015-07-17 ENCOUNTER — Encounter: Payer: Self-pay | Admitting: Advanced Practice Midwife

## 2015-07-17 ENCOUNTER — Ambulatory Visit: Payer: Medicaid Other | Admitting: Advanced Practice Midwife

## 2016-09-23 ENCOUNTER — Emergency Department (HOSPITAL_COMMUNITY)
Admission: EM | Admit: 2016-09-23 | Discharge: 2016-09-23 | Disposition: A | Discharge status: Home / Self Care | Payer: Medicaid Other | Attending: Emergency Medicine | Admitting: Emergency Medicine

## 2016-09-23 ENCOUNTER — Encounter (HOSPITAL_COMMUNITY): Payer: Self-pay | Admitting: *Deleted

## 2016-09-23 DIAGNOSIS — Z87891 Personal history of nicotine dependence: Secondary | ICD-10-CM | POA: Insufficient documentation

## 2016-09-23 DIAGNOSIS — Z79899 Other long term (current) drug therapy: Secondary | ICD-10-CM | POA: Insufficient documentation

## 2016-09-23 DIAGNOSIS — Z202 Contact with and (suspected) exposure to infections with a predominantly sexual mode of transmission: Secondary | ICD-10-CM

## 2016-09-23 LAB — WET PREP, GENITAL
SPERM: NONE SEEN
TRICH WET PREP: NONE SEEN
YEAST WET PREP: NONE SEEN

## 2016-09-23 LAB — POC URINE PREG, ED: Preg Test, Ur: NEGATIVE

## 2016-09-23 NOTE — ED Triage Notes (Signed)
Pt states she wants to be tested for everything because she has been having unprotected sex and states she heard her partner may have some kind of disease

## 2016-09-23 NOTE — Discharge Instructions (Signed)
You will be called if any of your tests are positive. Recheck if you get sores in your groin, then you can be tested for herpes.

## 2016-09-23 NOTE — ED Provider Notes (Signed)
[facility]PT Provider Note   CSN: [id number] Arrival date & time: 09/23/16  0201  Time seen 03:05 AM   History   Chief Complaint Chief Complaint  Patient presents with  . SEXUALLY TRANSMITTED DISEASE    HPI ANETTE BARRA is a 36 y.o. female.  HPI  patient states she's been having unprotected sex with a new partner for the past 8 months. She states she recently "heard from some girl" that he has herpes. Patient states she wants to be tested "for everything". She denies having any pelvic rash, abdominal discomfort, or vaginal discharge.  PCP [facility]   Past Medical History:  Diagnosis Date  . Medical history non-contributory   . Pregnant 10/17/2014    Patient Active Problem List   Diagnosis Date Noted  . Amniotic fluid leaking 06/06/2015  . 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 Previous cesarean delivery, antepartum 11/06/2014  . Supervision of normal pregnancy 10/17/2014    Past Surgical History:  Procedure Laterality Date  . CESAREAN SECTION    . DILATION AND CURETTAGE OF UTERUS    . INDUCED ABORTION  06/2014  . TUBAL LIGATION Bilateral 06/06/2015   Procedure: POST PARTUM TUBAL LIGATION;  Surgeon: Reva Bores, MD;  Location: [facility];  Service: Gynecology;  Laterality: Bilateral;    OB History    Gravida Para Term Preterm AB Living   4 3 3   1 3    SAB TAB Ectopic Multiple Live Births   1     0 3       Home Medications    None  Prior to Admission medications   Medication Sig Start Date End Date Taking? Authorizing Provider  ibuprofen (ADVIL,MOTRIN) 600 MG tablet Take 1 tablet (600 mg total) by mouth every 6 (six) hours as needed. 06/08/15   [name], CNM  oxyCODONE-acetaminophen (ROXICET) 5-325 MG tablet Take 1 tablet by mouth every 4 (four) hours as needed for severe pain. 06/08/15   [name], CNM  prenatal vitamin w/FE, FA (PRENATAL 1 + 1) 27-1 MG TABS tablet Take 1 tablet by mouth daily at 12  noon. Patient taking differently: Take 1 tablet by mouth at bedtime.  10/17/14   [name], NP    Family History History reviewed. No pertinent family history.  Social History Social History  Substance Use Topics  . Smoking status: Former Smoker    Types: Cigarettes  . Smokeless tobacco: Never Used  . Alcohol use No  employed Smokes 1/2 ppd   Allergies   Patient has no known allergies.   Review of Systems Review of Systems  All other systems reviewed and are negative.    Physical Exam Updated Vital Signs BP (!) 117/53 (BP Location: Left Arm)   Pulse 98   Temp 98.1 F (36.7 C) (Oral)   Resp 16   Ht 5\' 1"  (1.549 m)   Wt 148 lb (67.1 kg)   LMP 09/05/2016   SpO2 100%   BMI 27.96 kg/m   Vital signs normal     Physical Exam  Constitutional: She is oriented to person, place, and time. She appears well-developed and well-nourished.  Non-toxic appearance. She does not appear ill. No distress.  HENT:  Head: Normocephalic and atraumatic.  Right Ear: External ear normal.  Left Ear: External ear normal.  Nose: Nose normal.  Eyes: Conjunctivae and EOM are normal.  Neck: Normal range of motion and full passive range of motion without pain.  Cardiovascular:  Normal rate.   Pulmonary/Chest: Effort normal. No respiratory distress. She has no rhonchi. She exhibits no crepitus.  Abdominal: Soft. Normal appearance and bowel sounds are normal. She exhibits no distension. There is no tenderness. There is no rebound and no guarding.  Genitourinary:  Genitourinary Comments: Patient has normal external genitalia. She has scant vaginal discharge. Her uterus is normal size and nontender, her adnexa are nontender and without enlargement bilaterally. There were no lesions seen in her groin.  Musculoskeletal: Normal range of motion. She exhibits no edema or tenderness.  Moves all extremities well.   Neurological: She is alert and oriented to person, place, and time. She has  normal strength. No cranial nerve deficit.  Skin: Skin is warm, dry and intact. No rash noted. No erythema. No pallor.  Psychiatric: She has a normal mood and affect. Her speech is normal and behavior is normal. Her mood appears not anxious.  Nursing note and vitals reviewed.    ED Treatments / Results  Labs (all labs ordered are listed, but only abnormal results are displayed) Results for orders placed or performed during the hospital encounter of 09/23/16  Wet prep, genital  Result Value Ref Range   Yeast Wet Prep HPF POC NONE SEEN NONE SEEN   Trich, Wet Prep NONE SEEN NONE SEEN   Clue Cells Wet Prep HPF POC PRESENT NONE SEEN   WBC, Wet Prep HPF POC FEW (A) NONE SEEN   Sperm NONE SEEN   POC urine preg, ED  Result Value Ref Range   Preg Test, Ur NEGATIVE NEGATIVE   Laboratory interpretation all normal     Procedures Procedures (including critical care time)  Medications Ordered in ED Medications - No data to display   Initial Impression / Assessment and Plan / ED Course  I have reviewed the triage vital signs and the nursing notes.  Pertinent labs & imaging results that were available during my care of the patient were reviewed by me and considered in my medical decision making (see chart for details).  Patient presents wanting to be tested for STDs after finding out her sexual partner of 8 months may have had herpes according to another girl. Patient's exam was totally benign with no tenderness. She has no symptoms. She was not treated empirically.   Final Clinical Impressions(s) / ED Diagnoses   Final diagnoses:  Exposure to sexually transmitted disease (STD)    Plan discharge  Devoria AlbeIva Alverto Shedd, MD, Concha PyoFACEP     Doyle Kunath, MD 09/23/16 [phone]

## 2016-09-24 LAB — GC/CHLAMYDIA PROBE AMP (~~LOC~~) NOT AT ARMC
CHLAMYDIA, DNA PROBE: NEGATIVE
NEISSERIA GONORRHEA: NEGATIVE

## 2016-09-24 LAB — RPR: RPR Ser Ql: NONREACTIVE

## 2016-09-24 LAB — HIV ANTIBODY (ROUTINE TESTING W REFLEX): HIV SCREEN 4TH GENERATION: NONREACTIVE

## 2017-03-01 ENCOUNTER — Ambulatory Visit: Payer: Medicaid Other | Admitting: Obstetrics & Gynecology

## 2017-03-22 ENCOUNTER — Encounter (HOSPITAL_COMMUNITY): Payer: Self-pay

## 2017-03-22 ENCOUNTER — Emergency Department (HOSPITAL_COMMUNITY)
Admission: EM | Admit: 2017-03-22 | Discharge: 2017-03-22 | Disposition: A | Discharge status: Home / Self Care | Payer: BLUE CROSS/BLUE SHIELD | Attending: Emergency Medicine | Admitting: Emergency Medicine

## 2017-03-22 ENCOUNTER — Emergency Department (HOSPITAL_COMMUNITY): Payer: BLUE CROSS/BLUE SHIELD

## 2017-03-22 DIAGNOSIS — J069 Acute upper respiratory infection, unspecified: Secondary | ICD-10-CM

## 2017-03-22 DIAGNOSIS — R079 Chest pain, unspecified: Secondary | ICD-10-CM | POA: Insufficient documentation

## 2017-03-22 DIAGNOSIS — Z87891 Personal history of nicotine dependence: Secondary | ICD-10-CM | POA: Diagnosis not present

## 2017-03-22 DIAGNOSIS — Z79899 Other long term (current) drug therapy: Secondary | ICD-10-CM | POA: Diagnosis not present

## 2017-03-22 DIAGNOSIS — J0100 Acute maxillary sinusitis, unspecified: Secondary | ICD-10-CM | POA: Diagnosis not present

## 2017-03-22 DIAGNOSIS — R0981 Nasal congestion: Secondary | ICD-10-CM | POA: Diagnosis present

## 2017-03-22 DIAGNOSIS — M791 Myalgia: Secondary | ICD-10-CM | POA: Insufficient documentation

## 2017-03-22 MED ORDER — IBUPROFEN 600 MG PO TABS: 600.0000 mg | ORAL_TABLET | Freq: Four times a day (QID) | ORAL | 0 refills | Status: DC | PRN

## 2017-03-22 MED ORDER — PREDNISONE 20 MG PO TABS: ORAL_TABLET | ORAL | 0 refills | Status: DC

## 2017-03-22 MED ORDER — AZITHROMYCIN 250 MG PO TABS: 250.0000 mg | ORAL_TABLET | Freq: Every day | ORAL | 0 refills | Status: AC

## 2017-03-22 MED ORDER — LORATADINE 10 MG PO TABS: 10.0000 mg | ORAL_TABLET | Freq: Every day | ORAL | 0 refills | Status: AC

## 2017-03-22 MED ORDER — ALBUTEROL SULFATE HFA 108 (90 BASE) MCG/ACT IN AERS
1.0000 | INHALATION_SPRAY | RESPIRATORY_TRACT | Status: DC | PRN
  Administered 2017-03-22: 2 via RESPIRATORY_TRACT
  Filled 2017-03-22: qty 6.7

## 2017-03-22 NOTE — ED Notes (Signed)
Respiratory in room to educate about inhaler use at this time.

## 2017-03-22 NOTE — ED Triage Notes (Signed)
Reports of cough, congestion, and left ear ache since yesterday.

## 2017-03-22 NOTE — ED Provider Notes (Signed)
[facility] Provider Note   CSN: [id number] Arrival date & time: 03/22/17  4098     History   Chief Complaint Chief Complaint  Patient presents with  . Nasal Congestion    HPI Kelli Kennedy is a 36 y.o. female.  HPI Patient presents with 2 days of nasal congestion, left ear pain, sinus pressure, nonproductive cough and diffuse body aches. No definite fever or chills. Patient also has some anterior chest pain especially with coughing.no nausea, vomiting or diarrhea. No lower extremity swelling or pain. Past Medical History:  Diagnosis Date  . Medical history non-contributory   . Pregnant 10/17/2014    Patient Active Problem List   Diagnosis Date Noted  . Amniotic fluid leaking 06/06/2015  . 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 Previous cesarean delivery, antepartum 11/06/2014  . Supervision of normal pregnancy 10/17/2014    Past Surgical History:  Procedure Laterality Date  . CESAREAN SECTION    . DILATION AND CURETTAGE OF UTERUS    . INDUCED ABORTION  06/2014  . TUBAL LIGATION Bilateral 06/06/2015   Procedure: POST PARTUM TUBAL LIGATION;  Surgeon: Reva Bores, MD;  Location: [facility];  Service: Gynecology;  Laterality: Bilateral;    OB History    Gravida Para Term Preterm AB Living   SAB TAB Ectopic Multiple Live Births   1     0 3       Home Medications    Prior to Admission medications   Medication Sig Start Date End Date Taking? Authorizing Provider  azithromycin (ZITHROMAX) 250 MG tablet Take 1 tablet (250 mg total) by mouth daily. Take first 2 tablets together, then 1 every day until finished. 03/22/17   Loren Racer, MD  ibuprofen (ADVIL,MOTRIN) 600 MG tablet Take 1 tablet (600 mg total) by mouth every 6 (six) hours as needed. 03/22/17   Loren Racer, MD  loratadine (CLARITIN) 10 MG tablet Take 1 tablet (10 mg total) by mouth daily. 03/22/17   Loren Racer, MD  oxyCODONE-acetaminophen (ROXICET) 5-325 MG  tablet Take 1 tablet by mouth every 4 (four) hours as needed for severe pain. Patient not taking: Reported on 03/22/2017 06/08/15   [name], CNM  predniSONE (DELTASONE) 20 MG tablet 3 tabs po day one, then 2 po daily x 4 days 03/22/17   Loren Racer, MD  prenatal vitamin w/FE, FA (PRENATAL 1 + 1) 27-1 MG TABS tablet Take 1 tablet by mouth daily at 12 noon. Patient not taking: Reported on 03/22/2017 10/17/14   [name], NP    Family History No family history on file.  Social History Social History  Substance Use Topics  . Smoking status: Former Smoker    Types: Cigarettes  . Smokeless tobacco: Never Used  . Alcohol use No     Allergies   Patient has no known allergies.   Review of Systems Review of Systems  Constitutional: Positive for fatigue. Negative for chills and fever.  HENT: Positive for congestion, ear pain, sinus pain, sinus pressure and sore throat. Negative for trouble swallowing and voice change.   Eyes: Negative for photophobia and visual disturbance.  Respiratory: Positive for cough. Negative for shortness of breath and wheezing.   Cardiovascular: Positive for chest pain. Negative for palpitations and leg swelling.  Gastrointestinal: Negative for abdominal pain, diarrhea, nausea and vomiting.  Musculoskeletal: Positive for myalgias. Negative for back pain, neck pain and neck stiffness.  Skin: Negative for rash.  Neurological: Positive for headaches. Negative for dizziness, weakness, light-headedness and numbness.  All other systems reviewed and are negative.    Physical Exam Updated Vital Signs BP 114/70 (BP Location: Right Arm)   Pulse 93   Temp 98.5 F (36.9 C) (Oral)   Resp 18   Ht  (1.549 m)   Wt 72.6 kg (160 lb)   LMP 03/11/2017   SpO2 100%   BMI 30.23 kg/m   Physical Exam  Constitutional: She is oriented to person, place, and time. She appears well-developed and well-nourished.  HENT:  Head: Normocephalic and  atraumatic.  Mouth/Throat: Oropharynx is clear and moist. No oropharyngeal exudate.  Nasal mucosal edema left greater than right. Patient has tenderness to percussion over the left maxillary sinus. Bulging left TM without erythema. Oropharynx is mildly erythematous. No exudates present  Eyes: Pupils are equal, round, and reactive to light. EOM are normal.  Neck: Normal range of motion. Neck supple.  No meningismus. Bilateral anterior cervical adenopathy  Cardiovascular: Normal rate and regular rhythm.  Exam reveals no gallop and no friction rub.   No murmur heard. Pulmonary/Chest: Effort normal and breath sounds normal. No respiratory distress. She has no wheezes. She has no rales. She exhibits no tenderness.  Abdominal: Soft. Bowel sounds are normal. There is no tenderness. There is no rebound and no guarding.  Musculoskeletal: Normal range of motion. She exhibits no edema or tenderness.  No lower extremity swelling, asymmetry or tenderness.  Lymphadenopathy:    She has cervical adenopathy.  Neurological: She is alert and oriented to person, place, and time.  Moving all extremities without focal deficit. Sensation fully intact.  Skin: Skin is warm and dry. No rash noted. No erythema.  Psychiatric: She has a normal mood and affect. Her behavior is normal.  Nursing note and vitals reviewed.    ED Treatments / Results  Labs (all labs ordered are listed, but only abnormal results are displayed) Labs Reviewed - No data to display  EKG  EKG Interpretation None       Radiology Dg Chest 2 View  Result Date: 03/22/2017 CLINICAL DATA:  Productive cough, congestion, tightness in the chest with body aches. EXAM: CHEST  2 VIEW COMPARISON:  09/03/2010 FINDINGS: The heart size and mediastinal contours are within normal limits. Both lungs are clear. The visualized skeletal structures are unremarkable. IMPRESSION: No active cardiopulmonary disease. Electronically Signed   By: Elige Ko   On:  03/22/2017 09:52    Procedures Procedures (including critical care time)  Medications Ordered in ED Medications - No data to display   Initial Impression / Assessment and Plan / ED Course  I have reviewed the triage vital signs and the nursing notes.  Pertinent labs & imaging results that were available during my care of the patient were reviewed by me and considered in my medical decision making (see chart for details).       Final Clinical Impressions(s) / ED Diagnoses   Final diagnoses:  Upper respiratory tract infection, unspecified type  Acute maxillary sinusitis, recurrence not specified    New Prescriptions Discharge Medication List as of 03/22/2017 10:54 AM    START taking these medications   Details  azithromycin (ZITHROMAX) 250 MG tablet Take 1 tablet (250 mg total) by mouth daily. Take first 2 tablets together, then 1 every day until finished., Starting Mon 03/22/2017, Print    loratadine (CLARITIN) 10 MG tablet Take 1 tablet (10 mg total) by mouth daily., Starting Mon 03/22/2017, Print  predniSONE (DELTASONE) 20 MG tablet 3 tabs po day one, then 2 po daily x 4 days, Print         Loren Racer, MD 03/22/17 [phone]

## 2017-09-27 ENCOUNTER — Encounter (HOSPITAL_COMMUNITY): Payer: Self-pay | Admitting: Cardiology

## 2017-09-27 ENCOUNTER — Emergency Department (HOSPITAL_COMMUNITY): Payer: BLUE CROSS/BLUE SHIELD

## 2017-09-27 ENCOUNTER — Emergency Department (HOSPITAL_COMMUNITY)
Admission: EM | Admit: 2017-09-27 | Discharge: 2017-09-27 | Disposition: A | Discharge status: Home / Self Care | Payer: BLUE CROSS/BLUE SHIELD | Attending: Emergency Medicine | Admitting: Emergency Medicine

## 2017-09-27 DIAGNOSIS — Y999 Unspecified external cause status: Secondary | ICD-10-CM | POA: Diagnosis not present

## 2017-09-27 DIAGNOSIS — S99911A Unspecified injury of right ankle, initial encounter: Secondary | ICD-10-CM | POA: Diagnosis present

## 2017-09-27 DIAGNOSIS — X509XXA Other and unspecified overexertion or strenuous movements or postures, initial encounter: Secondary | ICD-10-CM | POA: Diagnosis not present

## 2017-09-27 DIAGNOSIS — Y929 Unspecified place or not applicable: Secondary | ICD-10-CM | POA: Insufficient documentation

## 2017-09-27 DIAGNOSIS — Y9301 Activity, walking, marching and hiking: Secondary | ICD-10-CM | POA: Diagnosis not present

## 2017-09-27 DIAGNOSIS — S93401A Sprain of unspecified ligament of right ankle, initial encounter: Secondary | ICD-10-CM | POA: Insufficient documentation

## 2017-09-27 DIAGNOSIS — Z87891 Personal history of nicotine dependence: Secondary | ICD-10-CM | POA: Insufficient documentation

## 2017-09-27 DIAGNOSIS — Z79899 Other long term (current) drug therapy: Secondary | ICD-10-CM | POA: Insufficient documentation

## 2017-09-27 MED ORDER — ONDANSETRON HCL 4 MG PO TABS
4.0000 mg | ORAL_TABLET | Freq: Once | ORAL | Status: AC
  Administered 2017-09-27: 4 mg via ORAL
  Filled 2017-09-27: qty 1

## 2017-09-27 MED ORDER — TRAMADOL HCL 50 MG PO TABS
100.0000 mg | ORAL_TABLET | Freq: Once | ORAL | Status: AC
  Administered 2017-09-27: 100 mg via ORAL
  Filled 2017-09-27: qty 2

## 2017-09-27 MED ORDER — TRAMADOL HCL 50 MG PO TABS: ORAL_TABLET | ORAL | 0 refills | Status: AC

## 2017-09-27 MED ORDER — IBUPROFEN 600 MG PO TABS: 600.0000 mg | ORAL_TABLET | Freq: Four times a day (QID) | ORAL | 0 refills | Status: AC

## 2017-09-27 MED ORDER — IBUPROFEN 800 MG PO TABS
800.0000 mg | ORAL_TABLET | Freq: Once | ORAL | Status: AC
  Administered 2017-09-27: 800 mg via ORAL
  Filled 2017-09-27: qty 1

## 2017-09-27 NOTE — ED Provider Notes (Signed)
[facility] EMERGENCY DEPARTMENT Provider Note   CSN: [id number] Arrival date & time: 09/27/17  1220     History   Chief Complaint Chief Complaint  Patient presents with  . Foot Injury    HPI Kelli Kennedy is a 37 y.o. female.  Patient is a 37 year old female who presents to the emergency department with a complaint of foot/ankle injury.  Patient states that about mid day today she made a miss step stepping on the ground, and her right foot twisted and she had a near fall.  She states after that she could not apply weight to the right lower extremity.  She has pain about the ankle and foot area.  The patient denies being on any anticoagulation medications.  She denies any previous operations or procedures involving the right ankle.  No other injuries reported at this time.  The history is provided by the patient.  Foot Injury      Past Medical History:  Diagnosis Date  . Medical history non-contributory   . Pregnant 10/17/2014    Patient Active Problem List   Diagnosis Date Noted  . Amniotic fluid leaking 06/06/2015  . 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 Previous cesarean delivery, antepartum 11/06/2014  . Supervision of normal pregnancy 10/17/2014    Past Surgical History:  Procedure Laterality Date  . CESAREAN SECTION    . DILATION AND CURETTAGE OF UTERUS    . INDUCED ABORTION  06/2014  . TUBAL LIGATION Bilateral 06/06/2015   Procedure: POST PARTUM TUBAL LIGATION;  Surgeon: Reva Bores, MD;  Location: [facility];  Service: Gynecology;  Laterality: Bilateral;     OB History    Gravida  4   Para  3   Term  3   Preterm      AB  1   Living  3     SAB  1   TAB      Ectopic      Multiple  0   Live Births  3            Home Medications    Prior to Admission medications   Medication Sig Start Date End Date Taking? Authorizing Provider  azithromycin (ZITHROMAX) 250 MG tablet Take 1 tablet (250 mg total) by mouth daily. Take  first 2 tablets together, then 1 every day until finished. 03/22/17   Loren Racer, MD  ibuprofen (ADVIL,MOTRIN) 600 MG tablet Take 1 tablet (600 mg total) by mouth every 6 (six) hours as needed. 03/22/17   Loren Racer, MD  loratadine (CLARITIN) 10 MG tablet Take 1 tablet (10 mg total) by mouth daily. 03/22/17   Loren Racer, MD  oxyCODONE-acetaminophen (ROXICET) 5-325 MG tablet Take 1 tablet by mouth every 4 (four) hours as needed for severe pain. Patient not taking: Reported on 03/22/2017 06/08/15   [name], CNM  predniSONE (DELTASONE) 20 MG tablet 3 tabs po day one, then 2 po daily x 4 days 03/22/17   Loren Racer, MD  prenatal vitamin w/FE, FA (PRENATAL 1 + 1) 27-1 MG TABS tablet Take 1 tablet by mouth daily at 12 noon. Patient not taking: Reported on 03/22/2017 10/17/14   [name], NP    Family History History reviewed. No pertinent family history.  Social History Social History   Tobacco Use  . Smoking status: Former Smoker    Types: Cigarettes  . Smokeless tobacco: Never Used  Substance Use Topics  . Alcohol use: No  . Drug use:  No     Allergies   Patient has no known allergies.   Review of Systems Review of Systems  Constitutional: Negative for activity change.       All ROS Neg except as noted in HPI  HENT: Negative for nosebleeds.   Eyes: Negative for photophobia and discharge.  Respiratory: Negative for cough, shortness of breath and wheezing.   Cardiovascular: Negative for chest pain and palpitations.  Gastrointestinal: Negative for abdominal pain and blood in stool.  Genitourinary: Negative for dysuria, frequency and hematuria.  Musculoskeletal: Positive for arthralgias. Negative for back pain and neck pain.  Skin: Negative.   Neurological: Negative for dizziness, seizures and speech difficulty.  Psychiatric/Behavioral: Negative for confusion and hallucinations.     Physical Exam Updated Vital Signs BP 108/79   Pulse 67    Temp 98.1 F (36.7 C) (Oral)   Resp 16   Ht 5\' 2"  (1.575 m)   Wt 66.7 kg (147 lb)   LMP 09/24/2017   SpO2 100%   BMI 26.89 kg/m   Physical Exam  Constitutional: She is oriented to person, place, and time. She appears well-developed and well-nourished.  Non-toxic appearance.  HENT:  Head: Normocephalic.  Right Ear: Tympanic membrane and external ear normal.  Left Ear: Tympanic membrane and external ear normal.  Eyes: Pupils are equal, round, and reactive to light. EOM and lids are normal.  Neck: Normal range of motion. Neck supple. Carotid bruit is not present.  Cardiovascular: Normal rate, regular rhythm, normal heart sounds, intact distal pulses and normal pulses.  Pulmonary/Chest: Breath sounds normal. No respiratory distress.  Abdominal: Soft. Bowel sounds are normal. There is no tenderness. There is no guarding.  Musculoskeletal:       Right ankle: She exhibits decreased range of motion. She exhibits normal pulse. Tenderness. Lateral malleolus and medial malleolus tenderness found. Achilles tendon normal.  Lymphadenopathy:       Head (right side): No submandibular adenopathy present.       Head (left side): No submandibular adenopathy present.    She has no cervical adenopathy.  Neurological: She is alert and oriented to person, place, and time. She has normal strength. No cranial nerve deficit or sensory deficit.  Skin: Skin is warm and dry.  Psychiatric: She has a normal mood and affect. Her speech is normal.  Nursing note and vitals reviewed.    ED Treatments / Results  Labs (all labs ordered are listed, but only abnormal results are displayed) Labs Reviewed - No data to display  EKG None  Radiology Dg Ankle Complete Right  Result Date: 09/27/2017 CLINICAL DATA:  Pain following fall EXAM: RIGHT ANKLE - COMPLETE 3+ VIEW COMPARISON:  None. FINDINGS: Frontal, oblique, and lateral views were obtained. No fracture or joint effusion. No appreciable joint space narrowing  or erosion. Ankle mortise appears intact. There is a small inferior calcaneal spur. IMPRESSION: Small inferior calcaneal spur. No appreciable fracture. No appreciable arthropathy. Ankle mortise appears intact. Electronically Signed   By: Bretta BangWilliam  Woodruff III M.D.   On: 09/27/2017 13:15   Dg Foot Complete Right  Result Date: 09/27/2017 CLINICAL DATA:  Twisted right ankle and foot.  Lateral pain. EXAM: RIGHT FOOT COMPLETE - 3+ VIEW COMPARISON:  None. FINDINGS: There is no evidence of fracture or dislocation. There is no evidence of arthropathy or other focal bone abnormality. Soft tissues are unremarkable. IMPRESSION: Negative. Electronically Signed   By: Charlett NoseKevin  Dover M.D.   On: 09/27/2017 13:16    Procedures Procedures (including  critical care time)  Medications Ordered in ED Medications - No data to display   Initial Impression / Assessment and Plan / ED Course  I have reviewed the triage vital signs and the nursing notes.  Pertinent labs & imaging results that were available during my care of the patient were reviewed by me and considered in my medical decision making (see chart for details).       Final Clinical Impressions(s) / ED Diagnoses MDM  Vital signs reviewed.  Examination is negative for any neurovascular deficit.  The x-ray of the right ankle as well as the right foot is negative for acute fracture.  There is noted a spur on the calcaneus.  I suspect the patient has an ankle sprain.  Patient is fitted with an ankle stirrup splint, crutches, and an ice pack was given.  Prescription for ibuprofen and Ultram given to the patient.  We discussed the importance of keeping the ankle elevated.  The patient is given a work note excusing her from work duty.  Patient will follow up with orthopedics if not improving.   Final diagnoses:  Sprain of right ankle, unspecified ligament, initial encounter    ED Discharge Orders        Ordered    ibuprofen (ADVIL,MOTRIN) 600 MG tablet   4 times daily     09/27/17 1511    traMADol (ULTRAM) 50 MG tablet     09/27/17 1511       [name], PA-C 09/27/17 1512    Mesner, Barbara Cower, MD 09/27/17 1538

## 2017-09-27 NOTE — Discharge Instructions (Signed)
Your x-rays are negative for fracture or dislocation.  Your examination favors an ankle sprain.  Please keep your foot elevated as much as possible.  Apply ice today and tomorrow.  Use ibuprofen with breakfast, lunch, dinner, and at bedtime.  May use Ultram for more severe pain.  This medication may cause drowsiness.  Please do not drive a vehicle, drink alcohol, operate machinery, participate in activities requiring concentration when taking this medication.  Please see Dr. Romeo AppleHarrison for orthopedic evaluation if not improving.  Please use your ankle stirrup splint over the next 7 days.

## 2017-09-27 NOTE — ED Triage Notes (Signed)
Larey SeatFell off of a porch this morning.  Approximately 2 feet.  C/o pain and swelling to right foot and right lower leg.

## 2018-04-04 ENCOUNTER — Other Ambulatory Visit: Payer: Self-pay

## 2018-04-04 ENCOUNTER — Encounter (HOSPITAL_COMMUNITY): Payer: Self-pay | Admitting: Emergency Medicine

## 2018-04-04 ENCOUNTER — Emergency Department (HOSPITAL_COMMUNITY)
Admission: EM | Admit: 2018-04-04 | Discharge: 2018-04-04 | Disposition: A | Discharge status: Home / Self Care | Payer: BLUE CROSS/BLUE SHIELD | Attending: Emergency Medicine | Admitting: Emergency Medicine

## 2018-04-04 DIAGNOSIS — Z79899 Other long term (current) drug therapy: Secondary | ICD-10-CM | POA: Insufficient documentation

## 2018-04-04 DIAGNOSIS — R1010 Upper abdominal pain, unspecified: Secondary | ICD-10-CM | POA: Diagnosis present

## 2018-04-04 DIAGNOSIS — R110 Nausea: Secondary | ICD-10-CM | POA: Diagnosis not present

## 2018-04-04 DIAGNOSIS — Z87891 Personal history of nicotine dependence: Secondary | ICD-10-CM | POA: Diagnosis not present

## 2018-04-04 LAB — URINALYSIS, ROUTINE W REFLEX MICROSCOPIC
BILIRUBIN URINE: NEGATIVE
Bacteria, UA: NONE SEEN
GLUCOSE, UA: NEGATIVE mg/dL
Ketones, ur: NEGATIVE mg/dL
Leukocytes, UA: NEGATIVE
NITRITE: NEGATIVE
PROTEIN: NEGATIVE mg/dL
SPECIFIC GRAVITY, URINE: 1.024 (ref 1.005–1.030)
pH: 5 (ref 5.0–8.0)

## 2018-04-04 LAB — CBC WITH DIFFERENTIAL/PLATELET
Basophils Absolute: 0 10*3/uL (ref 0.0–0.1)
Basophils Relative: 0 %
EOS ABS: 0.1 10*3/uL (ref 0.0–0.7)
Eosinophils Relative: 1 %
HCT: 35.1 % — ABNORMAL LOW (ref 36.0–46.0)
HEMOGLOBIN: 11.5 g/dL — AB (ref 12.0–15.0)
LYMPHS PCT: 29 %
Lymphs Abs: 1.4 10*3/uL (ref 0.7–4.0)
MCH: 29.1 pg (ref 26.0–34.0)
MCHC: 32.8 g/dL (ref 30.0–36.0)
MCV: 88.9 fL (ref 78.0–100.0)
MONOS PCT: 6 %
Monocytes Absolute: 0.3 10*3/uL (ref 0.1–1.0)
Neutro Abs: 2.9 10*3/uL (ref 1.7–7.7)
Neutrophils Relative %: 64 %
Platelets: 336 10*3/uL (ref 150–400)
RBC: 3.95 MIL/uL (ref 3.87–5.11)
RDW: 13.2 % (ref 11.5–15.5)
WBC: 4.6 10*3/uL (ref 4.0–10.5)

## 2018-04-04 LAB — COMPREHENSIVE METABOLIC PANEL
ALT: 13 U/L (ref 0–44)
AST: 17 U/L (ref 15–41)
Albumin: 3.6 g/dL (ref 3.5–5.0)
Alkaline Phosphatase: 45 U/L (ref 38–126)
Anion gap: 8 (ref 5–15)
BUN: 9 mg/dL (ref 6–20)
CHLORIDE: 108 mmol/L (ref 98–111)
CO2: 22 mmol/L (ref 22–32)
Calcium: 8.7 mg/dL — ABNORMAL LOW (ref 8.9–10.3)
Creatinine, Ser: 0.68 mg/dL (ref 0.44–1.00)
Glucose, Bld: 114 mg/dL — ABNORMAL HIGH (ref 70–99)
POTASSIUM: 3.5 mmol/L (ref 3.5–5.1)
SODIUM: 138 mmol/L (ref 135–145)
Total Bilirubin: 0.5 mg/dL (ref 0.3–1.2)
Total Protein: 7.6 g/dL (ref 6.5–8.1)

## 2018-04-04 LAB — I-STAT BETA HCG BLOOD, ED (MC, WL, AP ONLY): I-stat hCG, quantitative: <5 m[IU]/mL (ref <5)

## 2018-04-04 LAB — LIPASE, BLOOD: LIPASE: 22 U/L (ref 11–51)

## 2018-04-04 MED ORDER — ONDANSETRON HCL 4 MG/2ML IJ SOLN
4.0000 mg | Freq: Once | INTRAMUSCULAR | Status: AC
  Administered 2018-04-04: 4 mg via INTRAVENOUS
  Filled 2018-04-04: qty 2

## 2018-04-04 MED ORDER — PANTOPRAZOLE SODIUM 40 MG PO TBEC: 40.0000 mg | DELAYED_RELEASE_TABLET | Freq: Every day | ORAL | 0 refills | Status: AC

## 2018-04-04 MED ORDER — ONDANSETRON 4 MG PO TBDP: 4.0000 mg | ORAL_TABLET | Freq: Three times a day (TID) | ORAL | 0 refills | Status: DC | PRN

## 2018-04-04 MED ORDER — SODIUM CHLORIDE 0.9 % IV BOLUS
1000.0000 mL | Freq: Once | INTRAVENOUS | Status: AC
  Administered 2018-04-04: 1000 mL via INTRAVENOUS

## 2018-04-04 MED ORDER — PROMETHAZINE HCL 25 MG PO TABS: 25.0000 mg | ORAL_TABLET | Freq: Four times a day (QID) | ORAL | 0 refills | Status: AC | PRN

## 2018-04-04 NOTE — ED Triage Notes (Signed)
Pt c/o upper abd pain that radiates to her back on both sides since 0200 this am, pt reports nausea but no v/d, has had chills

## 2018-04-04 NOTE — ED Provider Notes (Signed)
[facility] EMERGENCY DEPARTMENT Provider Note   CSN: [id number] Arrival date & time: 04/04/18  4098     History   Chief Complaint Chief Complaint  Patient presents with  . Abdominal Pain    HPI Kelli Kennedy is a 37 y.o. female.  HPI  37 year old female presents with upper abdominal pain and nausea.  Started around 3 AM and woke her up from sleep.  She states her kids have had a recent stomach virus involving vomiting within the last week.  She had a normal bowel movement yesterday and try to have one today but could not.  Otherwise has not had constipation or diarrhea.  No urinary tract symptoms or vaginal symptoms.  The pain was across her entire upper abdomen and around to her back.  She took a Bentyl from a friend and this seemed to help.  However she has still had residual nausea since.  No blood in her stool.  No chest pain.  Overall the pain lasted around 30 minutes or so. Felt like a stomach ache.  Past Medical History:  Diagnosis Date  . Medical history non-contributory   . Pregnant 10/17/2014    Patient Active Problem List   Diagnosis Date Noted  . Amniotic fluid leaking 06/06/2015  . 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 Previous cesarean delivery, antepartum 11/06/2014  . Supervision of normal pregnancy 10/17/2014    Past Surgical History:  Procedure Laterality Date  . CESAREAN SECTION    . DILATION AND CURETTAGE OF UTERUS    . INDUCED ABORTION  06/2014  . TUBAL LIGATION Bilateral 06/06/2015   Procedure: POST PARTUM TUBAL LIGATION;  Surgeon: Reva Bores, MD;  Location: [facility];  Service: Gynecology;  Laterality: Bilateral;     OB History    Gravida  4   Para  3   Term  3   Preterm      AB  1   Living  3     SAB  1   TAB      Ectopic      Multiple  0   Live Births  3            Home Medications    Prior to Admission medications   Medication Sig Start Date End Date Taking? Authorizing Provider  azithromycin  (ZITHROMAX) 250 MG tablet Take 1 tablet (250 mg total) by mouth daily. Take first 2 tablets together, then 1 every day until finished. 03/22/17   Loren Racer, MD  ibuprofen (ADVIL,MOTRIN) 600 MG tablet Take 1 tablet (600 mg total) by mouth 4 (four) times daily. 09/27/17   [name], PA-C  loratadine (CLARITIN) 10 MG tablet Take 1 tablet (10 mg total) by mouth daily. 03/22/17   Loren Racer, MD  ondansetron (ZOFRAN ODT) 4 MG disintegrating tablet Take 1 tablet (4 mg total) by mouth every 8 (eight) hours as needed for nausea or vomiting. 04/04/18   [name], MD  oxyCODONE-acetaminophen (ROXICET) 5-325 MG tablet Take 1 tablet by mouth every 4 (four) hours as needed for severe pain. Patient not taking: Reported on 03/22/2017 06/08/15   [name], CNM  predniSONE (DELTASONE) 20 MG tablet 3 tabs po day one, then 2 po daily x 4 days 03/22/17   Loren Racer, MD  prenatal vitamin w/FE, FA (PRENATAL 1 + 1) 27-1 MG TABS tablet Take 1 tablet by mouth daily at 12 noon. Patient not taking: Reported on 03/22/2017 10/17/14   [name], NP  promethazine (PHENERGAN) 25 MG tablet Take 1 tablet (25 mg total) by mouth every 6 (six) hours as needed for nausea or vomiting. 04/04/18   [name], MD  traMADol [name]) 50 MG tablet 1  Or 2 po q6h prn pain 09/27/17   [name], PA-C    Family History History reviewed. No pertinent family history.  Social History Social History   Tobacco Use  . Smoking status: Former Smoker    Types: Cigarettes  . Smokeless tobacco: Never Used  Substance Use Topics  . Alcohol use: No  . Drug use: No     Allergies   Patient has no known allergies.   Review of Systems Review of Systems  Constitutional: Negative for fever.  Respiratory: Negative for shortness of breath.   Cardiovascular: Negative for chest pain.  Gastrointestinal: Positive for abdominal pain and nausea. Negative for blood in stool, constipation, diarrhea and vomiting.   Genitourinary: Positive for vaginal bleeding (on cycle). Negative for dysuria.  Musculoskeletal: Positive for back pain.  All other systems reviewed and are negative.    Physical Exam Updated Vital Signs BP 116/83 (BP Location: Right Arm)   Pulse 79   Temp 98.3 F (36.8 C) (Oral)   Resp 17   LMP 04/04/2018   SpO2 100%   Physical Exam  Constitutional: She is oriented to person, place, and time. She appears well-developed and well-nourished.  Non-toxic appearance. She does not appear ill. No distress.  HENT:  Head: Normocephalic and atraumatic.  Right Ear: External ear normal.  Left Ear: External ear normal.  Nose: Nose normal.  Eyes: Right eye exhibits no discharge. Left eye exhibits no discharge.  Cardiovascular: Normal rate, regular rhythm and normal heart sounds.  Pulmonary/Chest: Effort normal and breath sounds normal.  Abdominal: Soft. There is no tenderness. There is no CVA tenderness.  Neurological: She is alert and oriented to person, place, and time.  Skin: Skin is warm and dry.  Nursing note and vitals reviewed.    ED Treatments / Results  Labs (all labs ordered are listed, but only abnormal results are displayed) Labs Reviewed  COMPREHENSIVE METABOLIC PANEL - Abnormal; Notable for the following components:      Result Value   Glucose, Bld 114 (*)    Calcium 8.7 (*)    All other components within normal limits  CBC WITH DIFFERENTIAL/PLATELET - Abnormal; Notable for the following components:   Hemoglobin 11.5 (*)    HCT 35.1 (*)    All other components within normal limits  URINALYSIS, ROUTINE W REFLEX MICROSCOPIC - Abnormal; Notable for the following components:   Hgb urine dipstick MODERATE (*)    All other components within normal limits  LIPASE, BLOOD  I-STAT BETA HCG BLOOD, ED ([facility], [facility], [facility] ONLY)    EKG None  Radiology No results found.  Procedures Procedures (including critical care time)  Medications Ordered in ED Medications  sodium  chloride 0.9 % bolus 1,000 mL (1,000 mLs Intravenous New Bag/Given 04/04/18 0739)  ondansetron (ZOFRAN) injection 4 mg (4 mg Intravenous Given 04/04/18 0736)     Initial Impression / Assessment and Plan / ED Course  I have reviewed the triage vital signs and the nursing notes.  Pertinent labs & imaging results that were available during my care of the patient were reviewed by me and considered in my medical decision making (see chart for details).     Patient's lab work is reassuring.  While she did have some abdominal pain earlier, she does not  appear to have any now and this appears to be more of a nausea.  Probably related to her kids recent GI illness.  Otherwise her vital signs are unremarkable and I think she stable for discharge home with symptomatic care.  I highly doubt an acute intra-abdominal emergency.  Discharged home with return precautions.  Final Clinical Impressions(s) / ED Diagnoses   Final diagnoses:  Nausea in adult    ED Discharge Orders         Ordered    ondansetron (ZOFRAN ODT) 4 MG disintegrating tablet  Every 8 hours PRN     04/04/18 0837    promethazine (PHENERGAN) 25 MG tablet  Every 6 hours PRN     04/04/18 0837    pantoprazole (PROTONIX) 40 MG tablet  Daily     04/04/18 9604           [name], MD 04/04/18 [phone]

## 2018-04-04 NOTE — Discharge Instructions (Signed)
If you develop uncontrolled vomiting, abdominal pain, fever, or any other new/concerning symptoms then return to the ER for evaluation.

## 2018-05-25 ENCOUNTER — Emergency Department (HOSPITAL_COMMUNITY): Payer: BLUE CROSS/BLUE SHIELD

## 2018-05-25 ENCOUNTER — Encounter (HOSPITAL_COMMUNITY): Payer: Self-pay | Admitting: Emergency Medicine

## 2018-05-25 ENCOUNTER — Other Ambulatory Visit: Payer: Self-pay

## 2018-05-25 ENCOUNTER — Emergency Department (HOSPITAL_COMMUNITY)
Admission: EM | Admit: 2018-05-25 | Discharge: 2018-05-25 | Disposition: A | Discharge status: Home / Self Care | Payer: BLUE CROSS/BLUE SHIELD | Attending: Emergency Medicine | Admitting: Emergency Medicine

## 2018-05-25 DIAGNOSIS — J029 Acute pharyngitis, unspecified: Secondary | ICD-10-CM | POA: Diagnosis present

## 2018-05-25 DIAGNOSIS — J111 Influenza due to unidentified influenza virus with other respiratory manifestations: Secondary | ICD-10-CM | POA: Insufficient documentation

## 2018-05-25 DIAGNOSIS — Z87891 Personal history of nicotine dependence: Secondary | ICD-10-CM | POA: Diagnosis not present

## 2018-05-25 DIAGNOSIS — R69 Illness, unspecified: Secondary | ICD-10-CM

## 2018-05-25 DIAGNOSIS — Z79899 Other long term (current) drug therapy: Secondary | ICD-10-CM | POA: Insufficient documentation

## 2018-05-25 LAB — GROUP A STREP BY PCR: Group A Strep by PCR: NOT DETECTED

## 2018-05-25 MED ORDER — ACETAMINOPHEN 325 MG PO TABS
650.0000 mg | ORAL_TABLET | Freq: Once | ORAL | Status: AC
  Administered 2018-05-25: 650 mg via ORAL
  Filled 2018-05-25: qty 2

## 2018-05-25 MED ORDER — ALBUTEROL SULFATE HFA 108 (90 BASE) MCG/ACT IN AERS
2.0000 | INHALATION_SPRAY | RESPIRATORY_TRACT | Status: DC | PRN
  Administered 2018-05-25: 2 via RESPIRATORY_TRACT
  Filled 2018-05-25: qty 6.7

## 2018-05-25 MED ORDER — IPRATROPIUM-ALBUTEROL 0.5-2.5 (3) MG/3ML IN SOLN
3.0000 mL | Freq: Once | RESPIRATORY_TRACT | Status: AC
  Administered 2018-05-25: 3 mL via RESPIRATORY_TRACT
  Filled 2018-05-25: qty 3

## 2018-05-25 MED ORDER — IBUPROFEN 400 MG PO TABS
400.0000 mg | ORAL_TABLET | Freq: Once | ORAL | Status: AC
  Administered 2018-05-25: 400 mg via ORAL
  Filled 2018-05-25: qty 1

## 2018-05-25 MED ORDER — DEXAMETHASONE 4 MG PO TABS
10.0000 mg | ORAL_TABLET | Freq: Once | ORAL | Status: AC
  Administered 2018-05-25: 10 mg via ORAL
  Filled 2018-05-25: qty 3

## 2018-05-25 NOTE — ED Provider Notes (Signed)
[facility] EMERGENCY DEPARTMENT Provider Note   CSN: [id number] Arrival date & time: 05/25/18  0040     History   Chief Complaint Chief Complaint  Patient presents with  . Generalized Body Aches    HPI Kelli Kennedy is a 37 y.o. female.  The history is provided by the patient.  She complains of sore throat and cough which started 3 days ago.  Cough is productive of small amount of thick, yellow sputum.  She is also complaining of generalized body aches and some pain in her ears.  She rates her pain at 6/10.  She has been taking ibuprofen, but which is not giving her much relief.  She denies fever but has had chills and sweats.  She denies nausea or vomiting or diarrhea.  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  She has not had the flu shot this year.  Past Medical History:  Diagnosis Date  . Medical history non-contributory   . Pregnant 10/17/2014    Patient Active Problem List   Diagnosis Date Noted  . Amniotic fluid leaking 06/06/2015  . 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 Previous cesarean delivery, antepartum 11/06/2014  . Supervision of normal pregnancy 10/17/2014    Past Surgical History:  Procedure Laterality Date  . CESAREAN SECTION    . DILATION AND CURETTAGE OF UTERUS    . INDUCED ABORTION  06/2014  . TUBAL LIGATION Bilateral 06/06/2015   Procedure: POST PARTUM TUBAL LIGATION;  Surgeon: Reva Bores, MD;  Location: [facility];  Service: Gynecology;  Laterality: Bilateral;     OB History    Gravida  4   Para  3   Term  3   Preterm      AB  1   Living  3     SAB  1   TAB      Ectopic      Multiple  0   Live Births  3            Home Medications    Prior to Admission medications   Medication Sig Start Date End Date Taking? Authorizing Provider  azithromycin (ZITHROMAX) 250 MG tablet Take 1 tablet (250 mg total) by mouth daily. Take first 2 tablets together, then 1 every day until  finished. 03/22/17   Loren Racer, MD  ibuprofen (ADVIL,MOTRIN) 600 MG tablet Take 1 tablet (600 mg total) by mouth 4 (four) times daily. 09/27/17   [name], PA-C  loratadine (CLARITIN) 10 MG tablet Take 1 tablet (10 mg total) by mouth daily. 03/22/17   Loren Racer, MD  ondansetron (ZOFRAN ODT) 4 MG disintegrating tablet Take 1 tablet (4 mg total) by mouth every 8 (eight) hours as needed for nausea or vomiting. 04/04/18   [name], MD  oxyCODONE-acetaminophen (ROXICET) 5-325 MG tablet Take 1 tablet by mouth every 4 (four) hours as needed for severe pain. Patient not taking: Reported on 03/22/2017 06/08/15   [name], CNM  pantoprazole (PROTONIX) 40 MG tablet Take 1 tablet (40 mg total) by mouth daily. 04/04/18   [name], MD  predniSONE (DELTASONE) 20 MG tablet 3 tabs po day one, then 2 po daily x 4 days 03/22/17   Loren Racer, MD  prenatal vitamin w/FE, FA (PRENATAL 1 + 1) 27-1 MG TABS tablet Take 1 tablet by mouth daily at 12 noon. Patient not taking: Reported on 03/22/2017 10/17/14   [name], NP  promethazine (  PHENERGAN) 25 MG tablet Take 1 tablet (25 mg total) by mouth every 6 (six) hours as needed for nausea or vomiting. 04/04/18   Pricilla LovelessGoldston, Scott, MD  traMADol [name](ULTRAM) 50 MG tablet 1  Or 2 po q6h prn pain 09/27/17   Ivery QualeBryant, [place]son, PA-C    Family History No family history on file.  Social History Social History   Tobacco Use  . Smoking status: Former Smoker    Types: Cigarettes  . Smokeless tobacco: Never Used  Substance Use Topics  . Alcohol use: No  . Drug use: No     Allergies   Patient has no known allergies.   Review of Systems Review of Systems  All other systems reviewed and are negative.    Physical Exam Updated Vital Signs BP 128/87 (BP Location: Left Arm)   Pulse 79   Temp 98.2 F (36.8 C) (Oral)   Resp 17   Ht 5\' 4"  (1.626 m)   Wt 68 kg   LMP 05/04/2018   SpO2 99%   BMI 25.75 kg/m   Physical Exam    Nursing note and vitals reviewed.  37 year old female, resting comfortably and in no acute distress. Vital signs are normal. Oxygen saturation is 99%, which is normal. Head is normocephalic and atraumatic. PERRLA, EOMI. Oropharynx is mild erythematous without exudate.  Panic membranes are clear. Neck is nontender and supple without adenopathy or JVD. Back is nontender and there is no CVA tenderness. Lungs are clear without rales, wheezes, or rhonchi. Chest is nontender. Heart has regular rate and rhythm without murmur. Abdomen is soft, flat, nontender without masses or hepatosplenomegaly and peristalsis is normoactive. Extremities have no cyanosis or edema, full range of motion is present. Skin is warm and dry without rash. Neurologic: Mental status is normal, cranial nerves are intact, there are no motor or sensory deficits.  ED Treatments / Results  Labs (all labs ordered are listed, but only abnormal results are displayed) Labs Reviewed  GROUP A STREP BY PCR   Radiology Dg Chest 2 View  Result Date: 05/25/2018 CLINICAL DATA:  Cough and shortness of breath since Saturday. History of bronchitis. Former smoker. EXAM: CHEST - 2 VIEW COMPARISON:  03/22/2017 FINDINGS: The heart size and mediastinal contours are within normal limits. Both lungs are clear. The visualized skeletal structures are unremarkable. IMPRESSION: No active cardiopulmonary disease. Electronically Signed   By: Burman NievesWilliam  Stevens M.D.   On: 05/25/2018 01:37    Procedures Procedures  Medications Ordered in ED Medications  albuterol (PROVENTIL HFA;VENTOLIN HFA) 108 (90 Base) MCG/ACT inhaler 2 puff (has no administration in time range)  ibuprofen (ADVIL,MOTRIN) tablet 400 mg (400 mg Oral Given 05/25/18 0221)  acetaminophen (TYLENOL) tablet 650 mg (650 mg Oral Given 05/25/18 0221)  ipratropium-albuterol (DUONEB) 0.5-2.5 (3) MG/3ML nebulizer solution 3 mL (3 mLs Nebulization Given 05/25/18 0224)  dexamethasone  (DECADRON) tablet 10 mg (10 mg Oral Given 05/25/18 0221)     Initial Impression / Assessment and Plan / ED Course  I have reviewed the triage vital signs and the nursing notes.  Pertinent labs & imaging results that were available during my care of the patient were reviewed by me and considered in my medical decision making (see chart for details).  Influenza-like illness.  She is outside the treatment window for antiviral medication, so will not test for influenza.  Will check strep screen and chest x-ray.  Old records are reviewed, and she does have prior ED visit for upper respiratory infection.  Strep screen is negative.  Chest x-ray is negative for pneumonia.  She is given a nebulizer treatment with albuterol and ipratropium with significant improvement in cough.  She is discharged with an albuterol inhaler.  She is given a dose of dexamethasone in the emergency department.  Advised to use acetaminophen and ibuprofen as needed for fever and aching, return precautions discussed.  Final Clinical Impressions(s) / ED Diagnoses   Final diagnoses:  Influenza-like illness    ED Discharge Orders    None       Dione Booze, MD 05/25/18 [phone]

## 2018-05-25 NOTE — ED Triage Notes (Signed)
Pt C/O body aches, cough, congestion, and dizziness since Saturday.

## 2018-05-25 NOTE — Discharge Instructions (Addendum)
Drink plenty of fluids.  Take acetaminophen and ibuprofen as needed for fever or aching.

## 2019-05-07 ENCOUNTER — Emergency Department (HOSPITAL_COMMUNITY)
Admission: EM | Admit: 2019-05-07 | Discharge: 2019-05-07 | Disposition: A | Discharge status: Home / Self Care | Payer: BLUE CROSS/BLUE SHIELD | Attending: Emergency Medicine | Admitting: Emergency Medicine

## 2019-05-07 ENCOUNTER — Encounter (HOSPITAL_COMMUNITY): Payer: Self-pay

## 2019-05-07 ENCOUNTER — Other Ambulatory Visit: Payer: Self-pay

## 2019-05-07 DIAGNOSIS — L509 Urticaria, unspecified: Secondary | ICD-10-CM

## 2019-05-07 DIAGNOSIS — L500 Allergic urticaria: Secondary | ICD-10-CM | POA: Insufficient documentation

## 2019-05-07 DIAGNOSIS — Z87891 Personal history of nicotine dependence: Secondary | ICD-10-CM | POA: Insufficient documentation

## 2019-05-07 DIAGNOSIS — T7840XA Allergy, unspecified, initial encounter: Secondary | ICD-10-CM

## 2019-05-07 DIAGNOSIS — Z79899 Other long term (current) drug therapy: Secondary | ICD-10-CM | POA: Insufficient documentation

## 2019-05-07 MED ORDER — PREDNISONE 20 MG PO TABS: ORAL_TABLET | ORAL | 0 refills | Status: AC

## 2019-05-07 MED ORDER — FAMOTIDINE IN NACL 20-0.9 MG/50ML-% IV SOLN
20.0000 mg | Freq: Once | INTRAVENOUS | Status: AC
  Administered 2019-05-07: 03:00:00 20 mg via INTRAVENOUS
  Filled 2019-05-07: qty 50

## 2019-05-07 MED ORDER — METHYLPREDNISOLONE SODIUM SUCC 125 MG IJ SOLR
125.0000 mg | Freq: Once | INTRAMUSCULAR | Status: AC
  Administered 2019-05-07: 03:00:00 125 mg via INTRAVENOUS
  Filled 2019-05-07: qty 2

## 2019-05-07 MED ORDER — FAMOTIDINE 20 MG PO TABS: 20.0000 mg | ORAL_TABLET | Freq: Two times a day (BID) | ORAL | 0 refills | Status: AC

## 2019-05-07 MED ORDER — DIPHENHYDRAMINE HCL 50 MG/ML IJ SOLN
50.0000 mg | Freq: Once | INTRAMUSCULAR | Status: AC
  Administered 2019-05-07: 03:00:00 50 mg via INTRAVENOUS

## 2019-05-07 NOTE — ED Provider Notes (Signed)
[facility] EMERGENCY DEPARTMENT Provider Note   CSN: [id number] Arrival date & time: 05/07/19  0145   Time seen 2:30 AM  History   Chief Complaint Chief Complaint  Patient presents with  . Allergic Reaction    HPI Kelli Kennedy is a 38 y.o. female.     HPI patient states she went to seafood restaurant and ate crab legs about 8:30 PM.  She states she is eaten them before.  About midnight her right eye started itching and watering and then she started feeling warm and felt like she was swelling all over.  She states she is itching all over.  She denies any difficulty swallowing or breathing or having any swelling of her lips or tongue.  She states she has had an allergic reaction to shrimp in the past.  PCP Patient, No Pcp Per   Past Medical History:  Diagnosis Date  . Medical history non-contributory   . Pregnant 10/17/2014    Patient Active Problem List   Diagnosis Date Noted  . Amniotic fluid leaking 06/06/2015  . 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 Previous cesarean delivery, antepartum 11/06/2014  . Supervision of normal pregnancy 10/17/2014    Past Surgical History:  Procedure Laterality Date  . CESAREAN SECTION    . DILATION AND CURETTAGE OF UTERUS    . INDUCED ABORTION  06/2014  . TUBAL LIGATION Bilateral 06/06/2015   Procedure: POST PARTUM TUBAL LIGATION;  Surgeon: Donnamae Jude, MD;  Location: [facility];  Service: Gynecology;  Laterality: Bilateral;     OB History    Gravida  4   Para  3   Term  3   Preterm      AB  1   Living  3     SAB  1   TAB      Ectopic      Multiple  0   Live Births  3            Home Medications    Prior to Admission medications   Medication Sig Start Date End Date Taking? Authorizing Provider  azithromycin (ZITHROMAX) 250 MG tablet Take 1 tablet (250 mg total) by mouth daily. Take first 2 tablets together, then 1 every day until finished. 03/22/17   Julianne Rice, MD  famotidine  (PEPCID) 20 MG tablet Take 1 tablet (20 mg total) by mouth 2 (two) times daily. 05/07/19   Rolland Porter, MD  ibuprofen (ADVIL,MOTRIN) 600 MG tablet Take 1 tablet (600 mg total) by mouth 4 (four) times daily. 09/27/17   Lily Kocher, PA-C  loratadine (CLARITIN) 10 MG tablet Take 1 tablet (10 mg total) by mouth daily. 03/22/17   Julianne Rice, MD  ondansetron (ZOFRAN ODT) 4 MG disintegrating tablet Take 1 tablet (4 mg total) by mouth every 8 (eight) hours as needed for nausea or vomiting. 04/04/18   Sherwood Gambler, MD  oxyCODONE-acetaminophen (ROXICET) 5-325 MG tablet Take 1 tablet by mouth every 4 (four) hours as needed for severe pain. Patient not taking: Reported on 03/22/2017 06/08/15   Myrtis Ser, CNM  pantoprazole (PROTONIX) 40 MG tablet Take 1 tablet (40 mg total) by mouth daily. 04/04/18   Sherwood Gambler, MD  predniSONE (DELTASONE) 20 MG tablet Take 3 po QD x 3d , then 2 po QD x 3d then 1 po QD x 3d 05/07/19   Rolland Porter, MD  prenatal vitamin w/FE, FA (PRENATAL 1 + 1) 27-1 MG TABS tablet Take 1 tablet by mouth  daily at 12 noon. Patient not taking: Reported on 03/22/2017 10/17/14   [name], NP  promethazine (PHENERGAN) 25 MG tablet Take 1 tablet (25 mg total) by mouth every 6 (six) hours as needed for nausea or vomiting. 04/04/18   [name], MD  traMADol [name]) 50 MG tablet 1  Or 2 po q6h prn pain 09/27/17   [name], PA-C    Family History No family history on file.  Social History Social History   Tobacco Use  . Smoking status: Former Smoker    Types: Cigarettes  . Smokeless tobacco: Never Used  Substance Use Topics  . Alcohol use: No  . Drug use: No     Allergies   Shrimp [shellfish allergy]   Review of Systems Review of Systems  All other systems reviewed and are negative.    Physical Exam Updated Vital Signs BP 100/75   Pulse 83   Temp 98.1 F (36.7 C) (Oral)   Resp 17   Wt 68 kg   SpO2 98%   BMI 25.73 kg/m   Physical Exam  Vitals signs and nursing note reviewed.  Constitutional:      General: She is not in acute distress.    Appearance: Normal appearance. She is well-developed. She is not ill-appearing or toxic-appearing.  HENT:     Head: Normocephalic and atraumatic.     Right Ear: External ear normal.     Left Ear: External ear normal.     Nose: Nose normal. No mucosal edema or rhinorrhea.     Mouth/Throat:     Mouth: Mucous membranes are moist.     Dentition: No dental abscesses.     Pharynx: No oropharyngeal exudate, posterior oropharyngeal erythema or uvula swelling.     Comments: No swelling of lips or tongue Eyes:     Extraocular Movements: Extraocular movements intact.     Conjunctiva/sclera: Conjunctivae normal.     Pupils: Pupils are equal, round, and reactive to light.  Neck:     Musculoskeletal: Full passive range of motion without pain, normal range of motion and neck supple.  Cardiovascular:     Rate and Rhythm: Regular rhythm. Tachycardia present.     Heart sounds: Normal heart sounds. No murmur. No friction rub. No gallop.   Pulmonary:     Effort: Pulmonary effort is normal. No respiratory distress.     Breath sounds: Normal breath sounds. No wheezing, rhonchi or rales.  Chest:     Chest wall: No tenderness or crepitus.  Musculoskeletal: Normal range of motion.        General: No tenderness.     Comments: Moves all extremities well.   Skin:    General: Skin is warm and dry.     Coloration: Skin is not pale.     Findings: Rash present. No erythema.     Comments: Patient has diffuse small urticarial coalescing rash on her arms, trunk, and on her legs.  Neurological:     General: No focal deficit present.     Mental Status: She is alert and oriented to person, place, and time.     Cranial Nerves: No cranial nerve deficit.  Psychiatric:        Mood and Affect: Mood normal. Mood is not anxious.        Speech: Speech normal.        Behavior: Behavior normal.        Thought  Content: Thought content normal.      ED  Treatments / Results  Labs (all labs ordered are listed, but only abnormal results are displayed) Labs Reviewed - No data to display  EKG None  Radiology No results found.  Procedures Procedures (including critical care time)  Medications Ordered in ED Medications  diphenhydrAMINE (BENADRYL) injection 50 mg (50 mg Intravenous Given 05/07/19 0239)  methylPREDNISolone sodium succinate (SOLU-MEDROL) 125 mg/2 mL injection 125 mg (125 mg Intravenous Given 05/07/19 0304)  famotidine (PEPCID) IVPB 20 mg premix (0 mg Intravenous Stopped 05/07/19 0337)     Initial Impression / Assessment and Plan / ED Course  I have reviewed the triage vital signs and the nursing notes.  Pertinent labs & imaging results that were available during my care of the patient were reviewed by me and considered in my medical decision making (see chart for details).    Patient was given IV Benadryl, Solu-Medrol, and Pepcid.    Recheck at 4:30 AM patient is sleeping.  She states her itching is gone.  Her rash is faded away.  We discussed staying cool because heat will make her symptoms worse.  Final Clinical Impressions(s) / ED Diagnoses   Final diagnoses:  Urticaria  Allergic reaction, initial encounter    ED Discharge Orders         Ordered    predniSONE (DELTASONE) 20 MG tablet     05/07/19 0446    famotidine (PEPCID) 20 MG tablet  2 times daily     05/07/19 0446         Plan discharge  Devoria AlbeIva Shonica Weier, MD, Concha PyoFACEP    Tyreisha Ungar, MD 05/07/19 [id number]

## 2019-05-07 NOTE — Discharge Instructions (Addendum)
Stay cool, heat will make the itching or rash return or get worse.  Take the medications as prescribed until gone.  You can take Claritin or Zyrtec over-the-counter once a day for the next week.  You can use Benadryl 50 mg at bedtime if you have itching.  You should avoid crab legs in the future.

## 2019-05-07 NOTE — ED Triage Notes (Signed)
Pt reports eating at [place]" last night around 2000. Pt is allergic to shrimp. Pt ate crab legs last night, reports she has ate them there before and had no reaction. Pt has wheels generalized over body that itches. No edema noted to mouth or tongue.-  Pt can swallow, and talks without difficulty, says throat is "itchy".

## 2019-05-07 NOTE — ED Notes (Signed)
ED Provider at bedside. 

## 2019-07-31 ENCOUNTER — Ambulatory Visit: Payer: HRSA Program | Attending: Internal Medicine

## 2019-07-31 ENCOUNTER — Other Ambulatory Visit: Payer: Self-pay

## 2019-07-31 DIAGNOSIS — Z20822 Contact with and (suspected) exposure to covid-19: Secondary | ICD-10-CM

## 2019-08-01 LAB — NOVEL CORONAVIRUS, NAA: SARS-CoV-2, NAA: NOT DETECTED

## 2019-08-03 ENCOUNTER — Telehealth: Payer: Self-pay | Admitting: *Deleted

## 2019-08-03 NOTE — Telephone Encounter (Signed)
Patient is calling for COVID result- notified negative 

## 2019-08-07 ENCOUNTER — Ambulatory Visit: Payer: Self-pay | Attending: Internal Medicine

## 2019-08-07 ENCOUNTER — Other Ambulatory Visit: Payer: Self-pay

## 2019-08-07 DIAGNOSIS — Z20822 Contact with and (suspected) exposure to covid-19: Secondary | ICD-10-CM | POA: Insufficient documentation

## 2019-08-09 LAB — NOVEL CORONAVIRUS, NAA

## 2019-08-10 ENCOUNTER — Telehealth: Payer: Self-pay

## 2019-08-10 NOTE — Telephone Encounter (Signed)
Pt called to get clarification on test results from 2/1 *Unable to determine results due to PCR in specimen*   Will have nurse call back.   Joycelyn Rua Hopkins

## 2019-08-10 NOTE — Telephone Encounter (Signed)
Pt given result of COVID test obtained on 08/07/19, "We are UNABLE to reliably determine a result for the specimen due to the presence of PCR inhibitor(s) in the specimen submitted.If  clinically indicated, please recollect an additional specimen for testing."; explained test needs to be repeated for definitive result; she verbalized understanding.

## 2019-08-11 ENCOUNTER — Other Ambulatory Visit: Payer: Self-pay

## 2019-08-16 ENCOUNTER — Other Ambulatory Visit: Payer: Self-pay

## 2019-08-16 ENCOUNTER — Ambulatory Visit: Payer: Self-pay | Attending: Internal Medicine

## 2019-08-16 DIAGNOSIS — Z20822 Contact with and (suspected) exposure to covid-19: Secondary | ICD-10-CM | POA: Insufficient documentation

## 2019-08-17 LAB — NOVEL CORONAVIRUS, NAA: SARS-CoV-2, NAA: NOT DETECTED

## 2019-09-26 ENCOUNTER — Other Ambulatory Visit: Payer: Self-pay

## 2019-09-26 ENCOUNTER — Ambulatory Visit: Payer: Self-pay | Attending: Internal Medicine

## 2019-09-26 DIAGNOSIS — Z20822 Contact with and (suspected) exposure to covid-19: Secondary | ICD-10-CM | POA: Insufficient documentation

## 2019-09-28 LAB — SARS-COV-2, NAA 2 DAY TAT

## 2019-09-28 LAB — NOVEL CORONAVIRUS, NAA: SARS-CoV-2, NAA: NOT DETECTED

## 2019-10-04 ENCOUNTER — Ambulatory Visit: Payer: Self-pay | Attending: Internal Medicine

## 2019-10-04 ENCOUNTER — Other Ambulatory Visit: Payer: Self-pay

## 2019-10-04 DIAGNOSIS — Z20822 Contact with and (suspected) exposure to covid-19: Secondary | ICD-10-CM | POA: Insufficient documentation

## 2019-10-05 LAB — NOVEL CORONAVIRUS, NAA: SARS-CoV-2, NAA: NOT DETECTED

## 2020-08-16 ENCOUNTER — Encounter (HOSPITAL_COMMUNITY): Payer: Self-pay | Admitting: *Deleted

## 2020-08-16 ENCOUNTER — Other Ambulatory Visit: Payer: Self-pay

## 2020-08-16 ENCOUNTER — Emergency Department (HOSPITAL_COMMUNITY)
Admission: EM | Admit: 2020-08-16 | Discharge: 2020-08-16 | Disposition: A | Discharge status: Home / Self Care | Payer: Self-pay | Attending: Emergency Medicine | Admitting: Emergency Medicine

## 2020-08-16 DIAGNOSIS — Z87891 Personal history of nicotine dependence: Secondary | ICD-10-CM | POA: Insufficient documentation

## 2020-08-16 DIAGNOSIS — L309 Dermatitis, unspecified: Secondary | ICD-10-CM | POA: Insufficient documentation

## 2020-08-16 MED ORDER — HYDROCORTISONE 1 % EX CREA: TOPICAL_CREAM | CUTANEOUS | 0 refills | Status: AC

## 2020-08-16 MED ORDER — CEPHALEXIN 500 MG PO CAPS: 500.0000 mg | ORAL_CAPSULE | Freq: Two times a day (BID) | ORAL | 0 refills | Status: AC

## 2020-08-16 MED ORDER — CEPHALEXIN 500 MG PO CAPS: 500.0000 mg | ORAL_CAPSULE | Freq: Two times a day (BID) | ORAL | 0 refills | Status: DC

## 2020-08-16 MED ORDER — CEPHALEXIN 500 MG PO CAPS
500.0000 mg | ORAL_CAPSULE | Freq: Once | ORAL | Status: AC
  Administered 2020-08-16: 500 mg via ORAL
  Filled 2020-08-16: qty 1

## 2020-08-16 MED ORDER — LORATADINE 10 MG PO TABS
10.0000 mg | ORAL_TABLET | Freq: Once | ORAL | Status: AC
  Administered 2020-08-16: 10 mg via ORAL
  Filled 2020-08-16: qty 1

## 2020-08-16 NOTE — Discharge Instructions (Addendum)
Apply the topical steroid cream to help with itching.  Avoid scratching as much as possible. Take the Keflex as directed until gone. You can take an oral antihistamine such as Benadryl, or Claritin/Zyrtec for additional itch relief.  Follow with your PCP or urgent care for recheck in 2 days.  Return to the ED if worsening.

## 2020-08-16 NOTE — ED Triage Notes (Signed)
Left lower arm swollen noted last night, getting worse today, c/o itching, did not take any meds for same

## 2020-08-16 NOTE — ED Provider Notes (Signed)
[facility] EMERGENCY DEPARTMENT Provider Note   CSN: [id number] Arrival date & time: 08/16/20  1638     History Chief Complaint  Patient presents with  . Insect Bite    Kelli Kennedy is a 40 y.o. female presenting to the emergency department with complaint of 1 day of left arm itching and redness.  She states yesterday she noticed her arm was very itchy.  She scratch it throughout the night.  Today she noticed redness and swelling.  It is very minimally painful, mostly itching.  She has noticed some small rash.  She notes she was driving with her arm outside of the window though no other contacts that she can think of.  The history is provided by the patient.       Past Medical History:  Diagnosis Date  . Medical history non-contributory   . Pregnant 10/17/2014    Patient Active Problem List   Diagnosis Date Noted  . Amniotic fluid leaking 06/06/2015  . 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 Previous cesarean delivery, antepartum 11/06/2014  . Supervision of normal pregnancy 10/17/2014    Past Surgical History:  Procedure Laterality Date  . CESAREAN SECTION    . DILATION AND CURETTAGE OF UTERUS    . INDUCED ABORTION  06/2014  . TUBAL LIGATION Bilateral 06/06/2015   Procedure: POST PARTUM TUBAL LIGATION;  Surgeon: Reva Bores, MD;  Location: [facility];  Service: Gynecology;  Laterality: Bilateral;     OB History    Gravida  4   Para  3   Term  3   Preterm      AB  1   Living  3     SAB  1   IAB      Ectopic      Multiple  0   Live Births  3           No family history on file.  Social History   Tobacco Use  . Smoking status: Former Smoker    Types: Cigarettes  . Smokeless tobacco: Never Used  Substance Use Topics  . Alcohol use: No  . Drug use: No    Home Medications Prior to Admission medications   Medication Sig Start Date End Date Taking? Authorizing Provider  hydrocortisone cream 1 % Apply to affected area 2 times  daily 08/16/20  Yes Roxan Hockey, [place] N, PA-C  azithromycin (ZITHROMAX) 250 MG tablet Take 1 tablet (250 mg total) by mouth daily. Take first 2 tablets together, then 1 every day until finished. 03/22/17   Loren Racer, MD  cephALEXin (KEFLEX) 500 MG capsule Take 1 capsule (500 mg total) by mouth 2 (two) times daily for 5 days. 08/16/20 08/21/20  Marquesha Robideau, [place] N, PA-C  famotidine (PEPCID) 20 MG tablet Take 1 tablet (20 mg total) by mouth 2 (two) times daily. 05/07/19   [name], MD  ibuprofen (ADVIL,MOTRIN) 600 MG tablet Take 1 tablet (600 mg total) by mouth 4 (four) times daily. 09/27/17   [name], PA-C  loratadine (CLARITIN) 10 MG tablet Take 1 tablet (10 mg total) by mouth daily. 03/22/17   Loren Racer, MD  ondansetron (ZOFRAN ODT) 4 MG disintegrating tablet Take 1 tablet (4 mg total) by mouth every 8 (eight) hours as needed for nausea or vomiting. 04/04/18   [name], MD  oxyCODONE-acetaminophen (ROXICET) 5-325 MG tablet Take 1 tablet by mouth every 4 (four) hours as needed for severe pain. Patient not taking: Reported on 03/22/2017 06/08/15  [name], CNM  pantoprazole (PROTONIX) 40 MG tablet Take 1 tablet (40 mg total) by mouth daily. 04/04/18   [name], MD  predniSONE (DELTASONE) 20 MG tablet Take 3 po QD x 3d , then 2 po QD x 3d then 1 po QD x 3d 05/07/19   [name], MD  prenatal vitamin w/FE, FA (PRENATAL 1 + 1) 27-1 MG TABS tablet Take 1 tablet by mouth daily at 12 noon. Patient not taking: Reported on 03/22/2017 10/17/14   [name], NP  promethazine (PHENERGAN) 25 MG tablet Take 1 tablet (25 mg total) by mouth every 6 (six) hours as needed for nausea or vomiting. 04/04/18   [name], MD  traMADol [name]) 50 MG tablet 1  Or 2 po q6h prn pain 09/27/17   [name], PA-C    Allergies    Shrimp [shellfish allergy]  Review of Systems   Review of Systems  Constitutional: Negative for fever.  Skin: Positive for color change and rash.   All other systems reviewed and are negative.   Physical Exam Updated Vital Signs BP 110/79 (BP Location: Right Arm)   Pulse 85   Temp 98.1 F (36.7 C)   Resp 18   SpO2 100%   Physical Exam Vitals and nursing note reviewed.  Constitutional:      General: She is not in acute distress.    Appearance: She is well-developed.  HENT:     Head: Normocephalic and atraumatic.  Eyes:     Conjunctiva/sclera: Conjunctivae normal.  Cardiovascular:     Rate and Rhythm: Normal rate.  Pulmonary:     Effort: Pulmonary effort is normal.  Skin:    Comments: Left forearm with small patches of erythematous papules.  There is surrounding erythema and induration noted.  No tenderness.  Palpation causes itching.  No drainage or fluctuance.  Neurological:     Mental Status: She is alert.  Psychiatric:        Mood and Affect: Mood normal.        Behavior: Behavior normal.     ED Results / Procedures / Treatments   Labs (all labs ordered are listed, but only abnormal results are displayed) Labs Reviewed - No data to display  EKG None  Radiology No results found.  Procedures Procedures   Medications Ordered in ED Medications  loratadine (CLARITIN) tablet 10 mg (has no administration in time range)  cephALEXin (KEFLEX) capsule 500 mg (has no administration in time range)    ED Course  I have reviewed the triage vital signs and the nursing notes.  Pertinent labs & imaging results that were available during my care of the patient were reviewed by me and considered in my medical decision making (see chart for details).    MDM Rules/Calculators/A&P                          Patient with itchy rash to left forearm that began yesterday.  Became more red and swollen today.  Minimal pain.  She notes she had her arm out the window yesterday, however no other exposures she can think of.  Considered zoster, however she has minimal pain, seems less likely.  Most likely contact dermatitis based  on rash appearance.  Considering extent of surrounding inflammation, will cover with antibiotic for possible cellulitis.  Also prescribe topical steroid cream for itching and instructed patient to take oral antihistamine.  Recheck in 2 days.  Return to  the ED as needed.  Discussed results, findings, treatment and follow up. Patient advised of return precautions. Patient verbalized understanding and agreed with plan.  Final Clinical Impression(s) / ED Diagnoses Final diagnoses:  Dermatitis    Rx / DC Orders ED Discharge Orders         Ordered    cephALEXin (KEFLEX) 500 MG capsule  2 times daily,   Status:  Discontinued        08/16/20 1853    hydrocortisone cream 1 %        08/16/20 1853    cephALEXin (KEFLEX) 500 MG capsule  2 times daily        08/16/20 1853           Lovenia Debruler, [place] N, PA-C 08/16/20 1854    Bethann Berkshire, MD 08/18/20 [phone]

## 2020-11-16 ENCOUNTER — Emergency Department (HOSPITAL_COMMUNITY)
Admission: EM | Admit: 2020-11-16 | Discharge: 2020-11-16 | Disposition: A | Discharge status: Home / Self Care | Payer: Self-pay | Attending: Emergency Medicine | Admitting: Emergency Medicine

## 2020-11-16 ENCOUNTER — Other Ambulatory Visit: Payer: Self-pay

## 2020-11-16 DIAGNOSIS — Z87891 Personal history of nicotine dependence: Secondary | ICD-10-CM | POA: Insufficient documentation

## 2020-11-16 DIAGNOSIS — R112 Nausea with vomiting, unspecified: Secondary | ICD-10-CM | POA: Insufficient documentation

## 2020-11-16 DIAGNOSIS — R1013 Epigastric pain: Secondary | ICD-10-CM | POA: Insufficient documentation

## 2020-11-16 DIAGNOSIS — R197 Diarrhea, unspecified: Secondary | ICD-10-CM | POA: Insufficient documentation

## 2020-11-16 LAB — CBC WITH DIFFERENTIAL/PLATELET
Abs Immature Granulocytes: 0.02 10*3/uL (ref 0.00–0.07)
Basophils Absolute: 0 10*3/uL (ref 0.0–0.1)
Basophils Relative: 0 %
Eosinophils Absolute: 0.1 10*3/uL (ref 0.0–0.5)
Eosinophils Relative: 1 %
HCT: 39.8 % (ref 36.0–46.0)
Hemoglobin: 12.7 g/dL (ref 12.0–15.0)
Immature Granulocytes: 0 %
Lymphocytes Relative: 13 %
Lymphs Abs: 0.9 10*3/uL (ref 0.7–4.0)
MCH: 28.1 pg (ref 26.0–34.0)
MCHC: 31.9 g/dL (ref 30.0–36.0)
MCV: 88.1 fL (ref 80.0–100.0)
Monocytes Absolute: 0.3 10*3/uL (ref 0.1–1.0)
Monocytes Relative: 4 %
Neutro Abs: 5.5 10*3/uL (ref 1.7–7.7)
Neutrophils Relative %: 82 %
Platelets: 318 10*3/uL (ref 150–400)
RBC: 4.52 MIL/uL (ref 3.87–5.11)
RDW: 14.1 % (ref 11.5–15.5)
WBC: 6.7 10*3/uL (ref 4.0–10.5)
nRBC: 0 % (ref 0.0–0.2)

## 2020-11-16 LAB — COMPREHENSIVE METABOLIC PANEL
ALT: 25 U/L (ref 0–44)
AST: 18 U/L (ref 15–41)
Albumin: 4 g/dL (ref 3.5–5.0)
Alkaline Phosphatase: 57 U/L (ref 38–126)
Anion gap: 8 (ref 5–15)
BUN: 13 mg/dL (ref 6–20)
CO2: 23 mmol/L (ref 22–32)
Calcium: 9 mg/dL (ref 8.9–10.3)
Chloride: 106 mmol/L (ref 98–111)
Creatinine, Ser: 0.75 mg/dL (ref 0.44–1.00)
GFR, Estimated: >60 mL/min (ref >60)
Glucose, Bld: 117 mg/dL — ABNORMAL HIGH (ref 70–99)
Potassium: 3.5 mmol/L (ref 3.5–5.1)
Sodium: 137 mmol/L (ref 135–145)
Total Bilirubin: 0.9 mg/dL (ref 0.3–1.2)
Total Protein: 8.6 g/dL — ABNORMAL HIGH (ref 6.5–8.1)

## 2020-11-16 LAB — LIPASE, BLOOD: Lipase: 25 U/L (ref 11–51)

## 2020-11-16 MED ORDER — ONDANSETRON HCL 4 MG/2ML IJ SOLN
4.0000 mg | Freq: Once | INTRAMUSCULAR | Status: AC
  Administered 2020-11-16: 4 mg via INTRAVENOUS
  Filled 2020-11-16: qty 2

## 2020-11-16 MED ORDER — ONDANSETRON 4 MG PO TBDP: ORAL_TABLET | ORAL | 0 refills | Status: AC

## 2020-11-16 MED ORDER — SODIUM CHLORIDE 0.9 % IV BOLUS
1000.0000 mL | Freq: Once | INTRAVENOUS | Status: AC
  Administered 2020-11-16: 1000 mL via INTRAVENOUS

## 2020-11-16 MED ORDER — DIPHENHYDRAMINE HCL 50 MG/ML IJ SOLN
25.0000 mg | Freq: Once | INTRAMUSCULAR | Status: AC
  Administered 2020-11-16: 25 mg via INTRAVENOUS
  Filled 2020-11-16: qty 1

## 2020-11-16 MED ORDER — PROCHLORPERAZINE EDISYLATE 10 MG/2ML IJ SOLN
10.0000 mg | Freq: Once | INTRAMUSCULAR | Status: AC
  Administered 2020-11-16: 10 mg via INTRAVENOUS
  Filled 2020-11-16: qty 2

## 2020-11-16 MED ORDER — ALUM & MAG HYDROXIDE-SIMETH 200-200-20 MG/5ML PO SUSP
30.0000 mL | Freq: Once | ORAL | Status: AC
  Administered 2020-11-16: 30 mL via ORAL
  Filled 2020-11-16: qty 30

## 2020-11-16 NOTE — ED Provider Notes (Signed)
[facility] EMERGENCY DEPARTMENT Provider Note   CSN: [id number] Arrival date & time: 11/16/20  0556     History Chief Complaint  Patient presents with  . Emesis    Kelli Kennedy is a 40 y.o. female.  Nausea25 yo F with a chief complaints of vomiting.  This started today.  She also had an episode like this last week.  Associated with some loose stools as well.  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  She is having some epigastric burning prior to the vomiting today.  She denies any fevers.  The history is provided by the patient.  Emesis Severity:  Moderate Duration:  4 hours Timing:  Constant Progression:  Unchanged Chronicity:  New Relieved by:  Nothing Worsened by:  Nothing Ineffective treatments:  None tried Associated symptoms: abdominal pain   Associated symptoms: no arthralgias, no chills, no fever, no headaches and no myalgias        Past Medical History:  Diagnosis Date  . Medical history non-contributory   . Pregnant 10/17/2014    Patient Active Problem List   Diagnosis Date Noted  . Amniotic fluid leaking 06/06/2015  . 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 Previous cesarean delivery, antepartum 11/06/2014  . Supervision of normal pregnancy 10/17/2014    Past Surgical History:  Procedure Laterality Date  . CESAREAN SECTION    . DILATION AND CURETTAGE OF UTERUS    . INDUCED ABORTION  06/2014  . TUBAL LIGATION Bilateral 06/06/2015   Procedure: POST PARTUM TUBAL LIGATION;  Surgeon: Reva Bores, MD;  Location: [facility];  Service: Gynecology;  Laterality: Bilateral;     OB History    Gravida  4   Para  3   Term  3   Preterm      AB  1   Living  3     SAB  1   IAB      Ectopic      Multiple  0   Live Births  3           No family history on file.  Social History   Tobacco Use  . Smoking status: Former Smoker    Types: Cigarettes   . Smokeless tobacco: Never Used  Substance Use Topics  . Alcohol use: No  . Drug use: No    Home Medications Prior to Admission medications   Medication Sig Start Date End Date Taking? Authorizing Provider  ondansetron (ZOFRAN ODT) 4 MG disintegrating tablet 4mg  ODT q4 hours prn nausea/vomit 11/16/20  Yes 11/18/20, DO  azithromycin (ZITHROMAX) 250 MG tablet Take 1 tablet (250 mg total) by mouth daily. Take first 2 tablets together, then 1 every day until finished. 03/22/17   03/24/17, MD  famotidine (PEPCID) 20 MG tablet Take 1 tablet (20 mg total) by mouth 2 (two) times daily. 05/07/19   13/1/20, MD  hydrocortisone cream 1 % Apply to affected area 2 times daily 08/16/20   Robinson, 10/14/20 N, PA-C  ibuprofen (ADVIL,MOTRIN) 600 MG tablet Take 1 tablet (600 mg total) by mouth 4 (four) times daily. 09/27/17   09/29/17, PA-C  loratadine (CLARITIN) 10 MG tablet Take 1 tablet (10 mg total) by mouth daily. 03/22/17   03/24/17, MD  oxyCODONE-acetaminophen (ROXICET) 5-325 MG tablet Take 1 tablet by mouth every 4 (four) hours as needed for severe pain. Patient not taking: Reported  on 03/22/2017 06/08/15   [name], CNM  pantoprazole (PROTONIX) 40 MG tablet Take 1 tablet (40 mg total) by mouth daily. 04/04/18   [name], MD  predniSONE (DELTASONE) 20 MG tablet Take 3 po QD x 3d , then 2 po QD x 3d then 1 po QD x 3d 05/07/19   [name], MD  prenatal vitamin w/FE, FA (PRENATAL 1 + 1) 27-1 MG TABS tablet Take 1 tablet by mouth daily at 12 noon. Patient not taking: Reported on 03/22/2017 10/17/14   [name], NP  promethazine (PHENERGAN) 25 MG tablet Take 1 tablet (25 mg total) by mouth every 6 (six) hours as needed for nausea or vomiting. 04/04/18   [name], MD  traMADol [name]) 50 MG tablet 1  Or 2 po q6h prn pain 09/27/17   [name], PA-C    Allergies    Shrimp [shellfish allergy]  Review of Systems   Review of Systems  Constitutional:  Negative for chills and fever.  HENT: Negative for congestion and rhinorrhea.   Eyes: Negative for redness and visual disturbance.  Respiratory: Negative for shortness of breath and wheezing.   Cardiovascular: Negative for chest pain and palpitations.  Gastrointestinal: Positive for abdominal pain, nausea and vomiting.  Genitourinary: Negative for dysuria and urgency.  Musculoskeletal: Negative for arthralgias and myalgias.  Skin: Negative for pallor and wound.  Neurological: Negative for dizziness and headaches.    Physical Exam Updated Vital Signs BP 112/67   Pulse (!) 102   Temp 98.2 F (36.8 C)   Resp 19   Ht 5\' 4"  (1.626 m)   Wt 68 kg   SpO2 99%   BMI 25.73 kg/m   Physical Exam Vitals and nursing note reviewed.  Constitutional:      General: She is not in acute distress.    Appearance: She is well-developed. She is not diaphoretic.  HENT:     Head: Normocephalic and atraumatic.  Eyes:     Pupils: Pupils are equal, round, and reactive to light.  Cardiovascular:     Rate and Rhythm: Normal rate and regular rhythm.     Heart sounds: No murmur heard. No friction rub. No gallop.   Pulmonary:     Effort: Pulmonary effort is normal.     Breath sounds: No wheezing or rales.  Abdominal:     General: There is no distension.     Palpations: Abdomen is soft.     Tenderness: There is abdominal tenderness.     Comments: Mild epigastric   Musculoskeletal:        General: No tenderness.     Cervical back: Normal range of motion and neck supple.  Skin:    General: Skin is warm and dry.  Neurological:     Mental Status: She is alert and oriented to person, place, and time.  Psychiatric:        Behavior: Behavior normal.     ED Results / Procedures / Treatments   Labs (all labs ordered are listed, but only abnormal results are displayed) Labs Reviewed  COMPREHENSIVE METABOLIC PANEL - Abnormal; Notable for the following components:      Result Value   Glucose, Bld  117 (*)    Total Protein 8.6 (*)    All other components within normal limits  CBC WITH DIFFERENTIAL/PLATELET  LIPASE, BLOOD    EKG None  Radiology No results found.  Procedures Procedures   Medications Ordered in ED Medications  prochlorperazine (COMPAZINE) injection  10 mg (has no administration in time range)  diphenhydrAMINE (BENADRYL) injection 25 mg (has no administration in time range)  sodium chloride 0.9 % bolus 1,000 mL (1,000 mLs Intravenous New Bag/Given 11/16/20 0620)  ondansetron (ZOFRAN) injection 4 mg (4 mg Intravenous Given 11/16/20 0620)  alum & mag hydroxide-simeth (MAALOX/MYLANTA) 200-200-20 MG/5ML suspension 30 mL (30 mLs Oral Given 11/16/20 0347)    ED Course  I have reviewed the triage vital signs and the nursing notes.  Pertinent labs & imaging results that were available during my care of the patient were reviewed by me and considered in my medical decision making (see chart for details).    MDM Rules/Calculators/A&P                          40 yo F with a chief complaints of nausea and vomiting and epigastric abdominal pain.  Going on just this evening.  Well-appearing nontoxic.  Mild epigastric abdominal tenderness.  Will obtain a laboratory evaluation and treat nausea oral trial.  Nausea mildly improved, tolerating po.    7:07 AM:  I have discussed the diagnosis/risks/treatment options with the patient and believe the pt to be eligible for discharge home to follow-up with PCP. We also discussed returning to the ED immediately if new or worsening sx occur. We discussed the sx which are most concerning (e.g., sudden worsening pain, fever, inability to tolerate by mouth) that necessitate immediate return. Medications administered to the patient during their visit and any new prescriptions provided to the patient are listed below.  Medications given during this visit Medications  prochlorperazine (COMPAZINE) injection 10 mg (has no administration in time  range)  diphenhydrAMINE (BENADRYL) injection 25 mg (has no administration in time range)  sodium chloride 0.9 % bolus 1,000 mL (1,000 mLs Intravenous New Bag/Given 11/16/20 0620)  ondansetron (ZOFRAN) injection 4 mg (4 mg Intravenous Given 11/16/20 0620)  alum & mag hydroxide-simeth (MAALOX/MYLANTA) 200-200-20 MG/5ML suspension 30 mL (30 mLs Oral Given 11/16/20 0620)     The patient appears reasonably screen and/or stabilized for discharge and I doubt any other medical condition or other [facility] requiring further screening, evaluation, or treatment in the ED at this time prior to discharge.   Final Clinical Impression(s) / ED Diagnoses Final diagnoses:  Nausea and vomiting in adult    Rx / DC Orders ED Discharge Orders         Ordered    ondansetron (ZOFRAN ODT) 4 MG disintegrating tablet        11/16/20 0706           Melene Plan, DO 11/16/20 [phone]

## 2020-11-16 NOTE — Discharge Instructions (Signed)
Try pepcid or tagamet up to twice a day.  Try to avoid things that may make this worse, most commonly these are spicy foods tomato based products fatty foods chocolate and peppermint.  Alcohol and tobacco can also make this worse.  Return to the emergency department for sudden worsening pain fever or inability to eat or drink.  

## 2020-11-16 NOTE — ED Triage Notes (Signed)
BIB EMS c/o vomiting x2 since 0100. No c/o abd pain. States all she's had all day is [employer].

## 2021-07-30 ENCOUNTER — Other Ambulatory Visit: Payer: Self-pay

## 2021-07-30 ENCOUNTER — Encounter (HOSPITAL_COMMUNITY): Payer: Self-pay

## 2021-07-30 ENCOUNTER — Emergency Department (HOSPITAL_COMMUNITY): Payer: Self-pay

## 2021-07-30 ENCOUNTER — Emergency Department (HOSPITAL_COMMUNITY)
Admission: EM | Admit: 2021-07-30 | Discharge: 2021-07-30 | Disposition: A | Discharge status: Home / Self Care | Payer: Self-pay | Attending: Emergency Medicine | Admitting: Emergency Medicine

## 2021-07-30 DIAGNOSIS — M25562 Pain in left knee: Secondary | ICD-10-CM | POA: Insufficient documentation

## 2021-07-30 DIAGNOSIS — Y9301 Activity, walking, marching and hiking: Secondary | ICD-10-CM | POA: Insufficient documentation

## 2021-07-30 DIAGNOSIS — S93401A Sprain of unspecified ligament of right ankle, initial encounter: Secondary | ICD-10-CM | POA: Insufficient documentation

## 2021-07-30 DIAGNOSIS — M545 Low back pain, unspecified: Secondary | ICD-10-CM | POA: Insufficient documentation

## 2021-07-30 DIAGNOSIS — Y9289 Other specified places as the place of occurrence of the external cause: Secondary | ICD-10-CM | POA: Insufficient documentation

## 2021-07-30 DIAGNOSIS — W1831XA Fall on same level due to stepping on an object, initial encounter: Secondary | ICD-10-CM | POA: Insufficient documentation

## 2021-07-30 LAB — PREGNANCY, URINE: Preg Test, Ur: NEGATIVE

## 2021-07-30 MED ORDER — METHOCARBAMOL 500 MG PO TABS
500.0000 mg | ORAL_TABLET | Freq: Once | ORAL | Status: AC
  Administered 2021-07-30: 19:00:00 500 mg via ORAL
  Filled 2021-07-30: qty 1

## 2021-07-30 MED ORDER — METHOCARBAMOL 500 MG PO TABS: 500.0000 mg | ORAL_TABLET | Freq: Two times a day (BID) | ORAL | 0 refills | Status: AC

## 2021-07-30 MED ORDER — IBUPROFEN 400 MG PO TABS
600.0000 mg | ORAL_TABLET | Freq: Once | ORAL | Status: AC
  Administered 2021-07-30: 18:00:00 600 mg via ORAL
  Filled 2021-07-30: qty 2

## 2021-07-30 NOTE — ED Provider Notes (Signed)
[facility] EMERGENCY DEPARTMENT Provider Note   CSN: [id number] Arrival date & time: 07/30/21  1659     History  Chief Complaint  Patient presents with   Knee Pain   Leg Pain   Ankle Pain    Kelli Kennedy is a 41 y.o. female with no significant past medical history who presents to the ED complaining of left knee pain onset yesterday.  She notes that she was walking into work and stepped onto a black ice that per patient was not salted or marked which caused her to fall.  She has associated lower back pain, right ankle pain, right foot pain.  Has tried Tylenol with her last dose at 11 AM with no relief of her symptoms.  Denies nausea, vomiting, color change, wound, dizziness, lightheadedness, hitting her head, LOC.   The history is provided by the patient. No language interpreter was used.      Home Medications Prior to Admission medications   Medication Sig Start Date End Date Taking? Authorizing Provider  methocarbamol (ROBAXIN) 500 MG tablet Take 1 tablet (500 mg total) by mouth 2 (two) times daily. 07/30/21  Yes Anayla Giannetti A, PA-C  azithromycin (ZITHROMAX) 250 MG tablet Take 1 tablet (250 mg total) by mouth daily. Take first 2 tablets together, then 1 every day until finished. 03/22/17   Loren RacerYelverton, David, MD  famotidine (PEPCID) 20 MG tablet Take 1 tablet (20 mg total) by mouth 2 (two) times daily. 05/07/19   Devoria AlbeKnapp, Iva, MD  hydrocortisone cream 1 % Apply to affected area 2 times daily 08/16/20   Robinson, [place] N, PA-C  ibuprofen (ADVIL,MOTRIN) 600 MG tablet Take 1 tablet (600 mg total) by mouth 4 (four) times daily. 09/27/17   Ivery QualeBryant, Hobson, PA-C  loratadine (CLARITIN) 10 MG tablet Take 1 tablet (10 mg total) by mouth daily. 03/22/17   Loren RacerYelverton, David, MD  ondansetron (ZOFRAN ODT) 4 MG disintegrating tablet 4mg  ODT q4 hours prn nausea/vomit 11/16/20   Melene PlanFloyd, Dan, DO  oxyCODONE-acetaminophen (ROXICET) 5-325 MG tablet Take 1 tablet by mouth every 4 (four) hours as needed for  severe pain. Patient not taking: Reported on 03/22/2017 06/08/15   Arabella MerlesShaw, Kimberly D, CNM  pantoprazole (PROTONIX) 40 MG tablet Take 1 tablet (40 mg total) by mouth daily. 04/04/18   Pricilla LovelessGoldston, Scott, MD  predniSONE (DELTASONE) 20 MG tablet Take 3 po QD x 3d , then 2 po QD x 3d then 1 po QD x 3d 05/07/19   Devoria AlbeKnapp, Iva, MD  prenatal vitamin w/FE, FA (PRENATAL 1 + 1) 27-1 MG TABS tablet Take 1 tablet by mouth daily at 12 noon. Patient not taking: Reported on 03/22/2017 10/17/14   Adline PotterGriffin, Jennifer A, NP  promethazine (PHENERGAN) 25 MG tablet Take 1 tablet (25 mg total) by mouth every 6 (six) hours as needed for nausea or vomiting. 04/04/18   Pricilla LovelessGoldston, Scott, MD  traMADol [name](ULTRAM) 50 MG tablet 1  Or 2 po q6h prn pain 09/27/17   Ivery QualeBryant, Hobson, PA-C      Allergies    Shrimp [shellfish allergy]    Review of Systems   Review of Systems  Gastrointestinal:  Negative for nausea and vomiting.  Musculoskeletal:  Positive for arthralgias and back pain. Negative for joint swelling.  Skin:  Negative for color change, rash and wound.  Neurological:  Negative for syncope and headaches.  All other systems reviewed and are negative.  Physical Exam Updated Vital Signs BP 100/77    Pulse 89    Temp  98.2 F (36.8 C) (Oral)    Resp 17    Ht 5\' 4"  (1.626 m)    Wt 68 kg    LMP 07/06/2021 Comment: negative u-preg in ED 07/30/21   SpO2 100%    BMI 25.75 kg/m  Physical Exam Vitals and nursing note reviewed.  Constitutional:      General: She is not in acute distress.    Appearance: She is not diaphoretic.  HENT:     Head: Normocephalic and atraumatic.     Mouth/Throat:     Pharynx: No oropharyngeal exudate.  Eyes:     General: No scleral icterus.    Conjunctiva/sclera: Conjunctivae normal.  Cardiovascular:     Rate and Rhythm: Normal rate and regular rhythm.     Pulses: Normal pulses.     Heart sounds: Normal heart sounds.  Pulmonary:     Effort: Pulmonary effort is normal. No respiratory distress.     Breath  sounds: Normal breath sounds. No wheezing.  Abdominal:     General: Bowel sounds are normal.     Palpations: Abdomen is soft. There is no mass.     Tenderness: There is no abdominal tenderness. There is no guarding or rebound.  Musculoskeletal:        General: Normal range of motion.     Cervical back: Normal range of motion and neck supple.     Lumbar back: Tenderness and bony tenderness present. No swelling, edema, signs of trauma, lacerations or spasms. Negative right straight leg raise test and negative left straight leg raise test.     Right knee: Normal.     Left knee: No swelling or bony tenderness. Tenderness present over the medial joint line.     Right ankle: No swelling, deformity, ecchymosis or lacerations. Tenderness present over the lateral malleolus. Normal range of motion. Normal pulse.     Left ankle: Normal.     Comments: Mild tenderness to palpation to medial aspect of left knee.  No obvious ecchymosis, effusion, erythema.  No decreased range of motion of the left knee.  Full extension and flexion of left knee without difficulty.  Tenderness to palpation to dorsal aspect of right foot.  No obvious deformity, ecchymosis, crepitus.  Tenderness to palpation to lateral malleolus of right ankle.  Minimal swelling noted.  No tenderness to palpation noted to right lower leg.  Able to ambulate without assistance or difficulty with antalgic gait.  Tenderness to palpation noted to lumbar spine.  Tenderness to palpation noted to left lumbar musculature.  Negative straight leg raise bilaterally.   Skin:    General: Skin is warm and dry.  Neurological:     Mental Status: She is alert.  Psychiatric:        Behavior: Behavior normal.    ED Results / Procedures / Treatments   Labs (all labs ordered are listed, but only abnormal results are displayed) Labs Reviewed  PREGNANCY, URINE    EKG None  Radiology DG Lumbar Spine Complete  Result Date: 07/30/2021 CLINICAL DATA:  Pain.  EXAM: LUMBAR SPINE - COMPLETE 4+ VIEW COMPARISON:  None. FINDINGS: There is no evidence of lumbar spine fracture. Alignment is normal. Intervertebral disc spaces are maintained. There are surgical clips in the pelvis. IMPRESSION: Negative. Electronically Signed   By: 08/01/2021 M.D.   On: 07/30/2021 19:04   DG Knee 2 Views Left  Result Date: 07/30/2021 CLINICAL DATA:  Trauma, fall EXAM: LEFT KNEE - 1-2 VIEW COMPARISON:  None. FINDINGS: No  evidence of fracture, dislocation, or joint effusion. No evidence of arthropathy or other focal bone abnormality. Soft tissues are unremarkable. IMPRESSION: No radiographic abnormality is seen in the left knee. Electronically Signed   By: Ernie AvenaPalani  Rathinasamy M.D.   On: 07/30/2021 18:16   DG Ankle 2 Views Right  Result Date: 07/30/2021 CLINICAL DATA:  Trauma, fall EXAM: RIGHT ANKLE - 2 VIEW COMPARISON:  None. FINDINGS: No displaced fracture or is seen. Small bony spur is seen in the anterior aspect of distal tibia. Bony spurs are noted in the dorsal aspect of talonavicular joint. Small plantar spur is seen in the calcaneus. IMPRESSION: No recent fracture or dislocation is seen in right ankle. Other findings as described in the body of the report. Electronically Signed   By: Ernie AvenaPalani  Rathinasamy M.D.   On: 07/30/2021 18:20   DG Foot 2 Views Right  Result Date: 07/30/2021 CLINICAL DATA:  Trauma, fall EXAM: RIGHT FOOT - 2 VIEW COMPARISON:  None. FINDINGS: No recent displaced fracture or dislocation is seen. There are no opaque foreign bodies. Bony spurs seen in the dorsal aspect of talonavicular joint. Small plantar spur is seen in calcaneus. IMPRESSION: No fracture or dislocation is seen in the right foot. Bony spurs seen in the dorsal aspect of talonavicular joint. Small plantar spur is seen in calcaneus. Electronically Signed   By: Ernie AvenaPalani  Rathinasamy M.D.   On: 07/30/2021 18:18    Procedures Procedures    Medications Ordered in ED Medications  ibuprofen  (ADVIL) tablet 600 mg (600 mg Oral Given 07/30/21 1825)  methocarbamol (ROBAXIN) tablet 500 mg (500 mg Oral Given 07/30/21 1907)    ED Course/ Medical Decision Making/ A&P                           Medical Decision Making Amount and/or Complexity of Data Reviewed Labs: ordered. Radiology: ordered.  Risk Prescription drug management.   Patient with left knee pain, right ankle pain, lower back pain onset yesterday status post fall on black ice.  Tried tylenol at home. On exam, patient with mild tenderness to palpation to lumbar spine and musculature.  No tenderness to palpation to medial aspect of left knee.  Full active range of motion of left knee.  Tenderness to palpation to the dorsal aspect of right foot.  No tenderness to palpation to base of right fifth MTP.  Tender to palpation to the lateral malleolus of right ankle with minimal swelling.  No tenderness to palpation noted to right lower leg.  Able to ambulate without assistance or difficulty with antalgic gait.  Sensation and pulses intact bilaterally to lower extremities.  Differential diagnosis includes sprain, fracture, dislocation, normal muscle soreness.  Labs:  I ordered, and personally interpreted labs.  The pertinent results include:  Urine pregnancy negative.   Imaging: I ordered imaging studies including right foot x-ray, right ankle x-ray, left knee x-ray, lumbar spine x-ray I independently visualized and interpreted imaging which showed: Right foot x-ray: No acute fracture or dislocation. Right ankle x-ray: No acute fracture or dislocation Left knee x-ray: No acute fracture or dislocation. Lumbar x-ray: negative for acute fracture or dislocation. I agree with the radiologist interpretation  Medications:  I ordered medication including ibuprofen, Robaxin, ice and heat compress for pain management Reevaluation of the patient after these medicines showed that the patient improved I have reviewed the patients home  medicines and have made adjustments as needed  Reevaluation: After the interventions noted above,  I reevaluated the patient and found that they have :improved   Disposition: Patient presentation suspicious for right ankle sprain and normal muscle soreness following fall.  Doubt fracture or dislocation. After consideration of the diagnostic results and the patients response to treatment, I feel that the patient would benefit from discharge home with ace wraps and prescription for Robaxin.  Discussed with patient at bedside regarding no driving or operating heavy machinery with Robaxin, patient agreeable at this time.  Supportive care measures and strict return precautions discussed with patient at bedside. Pt acknowledges and verbalizes understanding. Pt appears safe for discharge. Follow up as indicated in discharge paperwork.    This chart was dictated using voice recognition software, Dragon. Despite the best efforts of this provider to proofread and correct errors, errors may still occur which can change documentation meaning.   Final Clinical Impression(s) / ED Diagnoses Final diagnoses:  Acute pain of left knee  Acute bilateral low back pain without sciatica  Sprain of right ankle, unspecified ligament, initial encounter    Rx / DC Orders ED Discharge Orders          Ordered    methocarbamol (ROBAXIN) 500 MG tablet  2 times daily        07/30/21 [address] Vendetta Pittinger A, PA-C 07/30/21 2248    Gerhard Munch, MD 07/31/21 1742

## 2021-07-30 NOTE — ED Notes (Signed)
Patient transported to CT 

## 2021-07-30 NOTE — Discharge Instructions (Addendum)
It was a pleasure taking care of you today!  Your x-rays didn't show any fractures or dislocations. You will be discharged with Robaxin, take as directed. Do not drive or operate heavy machinery with this medication as it can make you drowsy. You may take over-the-counter 600 mg ibuprofen every 6 hours or 1,000 mg Tylenol every 6 hours as needed for pain.  You may apply ice or heat to the affected areas for up to 15 minutes at a time. Ensure to place a barrier between your skin and the ice/heat. Ensure to elevate your legs as well as you may wear the ace wrap during the day and remove at night. You may follow-up with your primary care provider as needed.  Return to the ED if you experience increasing/worsening pain, inability to walk, increasing swelling, redness, or worsening symptoms.

## 2021-07-30 NOTE — ED Triage Notes (Addendum)
Patient walking into work and stepped onto black ice that was not salted or marked on the sidewalk close to the entrance causing fall. This resulted in injury to left knee causing knee pain as well as lower back pain with pain moving down left leg and right ankle pain.

## 2022-01-31 ENCOUNTER — Other Ambulatory Visit: Payer: Self-pay

## 2022-01-31 ENCOUNTER — Emergency Department (HOSPITAL_COMMUNITY)
Admission: EM | Admit: 2022-01-31 | Discharge: 2022-01-31 | Disposition: A | Discharge status: Home / Self Care | Payer: Self-pay | Attending: Emergency Medicine | Admitting: Emergency Medicine

## 2022-01-31 ENCOUNTER — Encounter (HOSPITAL_COMMUNITY): Payer: Self-pay | Admitting: Emergency Medicine

## 2022-01-31 DIAGNOSIS — Z87891 Personal history of nicotine dependence: Secondary | ICD-10-CM | POA: Insufficient documentation

## 2022-01-31 DIAGNOSIS — R000 Tachycardia, unspecified: Secondary | ICD-10-CM | POA: Insufficient documentation

## 2022-01-31 DIAGNOSIS — J029 Acute pharyngitis, unspecified: Secondary | ICD-10-CM

## 2022-01-31 DIAGNOSIS — J039 Acute tonsillitis, unspecified: Secondary | ICD-10-CM | POA: Insufficient documentation

## 2022-01-31 DIAGNOSIS — H9202 Otalgia, left ear: Secondary | ICD-10-CM | POA: Insufficient documentation

## 2022-01-31 DIAGNOSIS — U071 COVID-19: Secondary | ICD-10-CM | POA: Insufficient documentation

## 2022-01-31 LAB — GROUP A STREP BY PCR: Group A Strep by PCR: NOT DETECTED

## 2022-01-31 LAB — RESP PANEL BY RT-PCR (FLU A&B, COVID) ARPGX2
Influenza A by PCR: NEGATIVE
Influenza B by PCR: NEGATIVE
SARS Coronavirus 2 by RT PCR: POSITIVE — AB

## 2022-01-31 MED ORDER — KETOROLAC TROMETHAMINE 15 MG/ML IJ SOLN
15.0000 mg | Freq: Once | INTRAMUSCULAR | Status: AC
  Administered 2022-01-31: 15 mg via INTRAVENOUS
  Filled 2022-01-31: qty 1

## 2022-01-31 MED ORDER — ONDANSETRON HCL 4 MG/2ML IJ SOLN
4.0000 mg | Freq: Once | INTRAMUSCULAR | Status: AC
  Administered 2022-01-31: 4 mg via INTRAVENOUS
  Filled 2022-01-31: qty 2

## 2022-01-31 MED ORDER — DEXAMETHASONE SODIUM PHOSPHATE 10 MG/ML IJ SOLN
10.0000 mg | Freq: Once | INTRAMUSCULAR | Status: AC
  Administered 2022-01-31: 10 mg via INTRAVENOUS
  Filled 2022-01-31: qty 1

## 2022-01-31 MED ORDER — LACTATED RINGERS IV BOLUS
1000.0000 mL | Freq: Once | INTRAVENOUS | Status: AC
  Administered 2022-01-31: 1000 mL via INTRAVENOUS

## 2022-01-31 NOTE — ED Triage Notes (Signed)
Pt to the ED with complaints of sore throat, nausea, chills, and generalized body aches for the last 2-3 days.  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

## 2022-01-31 NOTE — ED Provider Notes (Signed)
[facility] EMERGENCY DEPARTMENT Provider Note   CSN: [id number] Arrival date & time: 01/31/22  1013     History  Chief Complaint  Patient presents with   Sore Throat    Kelli Kennedy is a 41 y.o. female with no significant medical history presents to the emergency department today for evaluation of sore throat and other associated symptoms to include nausea, chills, body aches and headaches.  Symptoms started approximately 2 to 3 days ago and have been worsening.  She took a Tylenol last night for her headache which only had temporary improvement in symptoms.  She notes that her sore throat is the worst of her symptoms and feels worse on the left side and causes a left-sided ear pain.  Difficulty swallowing and now reluctant to speak due to pain and discomfort.  She does not think that she has had fevers, however she has not been checking at home.  She endorses an occasional dry sounding cough.  Denies chest pain, shortness of breath, abdominal pain, vomiting and diarrhea.  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  Sore Throat Associated symptoms include headaches. Pertinent negatives include no chest pain and no abdominal pain.       Home Medications Prior to Admission medications   Medication Sig Start Date End Date Taking? Authorizing Provider  ibuprofen (ADVIL,MOTRIN) 600 MG tablet Take 1 tablet (600 mg total) by mouth 4 (four) times daily. 09/27/17  Yes [name], PA-C  ondansetron (ZOFRAN ODT) 4 MG disintegrating tablet 4mg  ODT q4 hours prn nausea/vomit 11/16/20  Yes 11/18/20, DO  azithromycin (ZITHROMAX) 250 MG tablet Take 1 tablet (250 mg total) by mouth daily. Take first 2 tablets together, then 1 every day until finished. 03/22/17   03/24/17, MD  famotidine (PEPCID) 20 MG tablet Take 1 tablet (20 mg total) by mouth 2 (two) times daily. 05/07/19   13/1/20, MD  hydrocortisone cream 1 % Apply to affected  area 2 times daily 08/16/20   Robinson, 10/14/20 N, PA-C  loratadine (CLARITIN) 10 MG tablet Take 1 tablet (10 mg total) by mouth daily. 03/22/17   03/24/17, MD  methocarbamol (ROBAXIN) 500 MG tablet Take 1 tablet (500 mg total) by mouth 2 (two) times daily. 07/30/21   Blue, Soijett A, PA-C  oxyCODONE-acetaminophen (ROXICET) 5-325 MG tablet Take 1 tablet by mouth every 4 (four) hours as needed for severe pain. Patient not taking: Reported on 03/22/2017 06/08/15   14/3/16, CNM  pantoprazole (PROTONIX) 40 MG tablet Take 1 tablet (40 mg total) by mouth daily. 04/04/18   04/06/18, MD  predniSONE (DELTASONE) 20 MG tablet Take 3 po QD x 3d , then 2 po QD x 3d then 1 po QD x 3d 05/07/19   13/1/20, MD  prenatal vitamin w/FE, FA (PRENATAL 1 + 1) 27-1 MG TABS tablet Take 1 tablet by mouth daily at 12 noon. Patient not taking: Reported on 03/22/2017 10/17/14   10/19/14, NP  promethazine (PHENERGAN) 25 MG tablet Take 1 tablet (25 mg total) by mouth every 6 (six) hours as needed for nausea or vomiting. 04/04/18   04/06/18, MD  traMADol [name]) 50 MG tablet 1  Or 2 po q6h prn pain 09/27/17   09/29/17, PA-C      Allergies    Shrimp [shellfish allergy]    Review of Systems   Review of Systems  Constitutional:  Negative for fever.  HENT:  Positive for ear pain and sore throat.   Cardiovascular:  Negative for chest pain.  Gastrointestinal:  Positive for nausea. Negative for abdominal pain, diarrhea and vomiting.  Neurological:  Positive for headaches.    Physical Exam Updated Vital Signs BP 103/75   Pulse (!) 103   Temp 100.1 F (37.8 C) (Oral)   Resp 16   Ht 5\' 4"  (1.626 m)   Wt 76.2 kg   LMP 12/08/2021 (Exact Date)   SpO2 98%   BMI 28.84 kg/m  Physical Exam Vitals and nursing note reviewed.  Constitutional:      General: She is not in acute distress.    Appearance: She is ill-appearing.  HENT:     Head: Atraumatic.     Right Ear: No middle ear  effusion. Tympanic membrane is not erythematous.     Left Ear: A middle ear effusion is present. Tympanic membrane is not erythematous.     Nose: No congestion.     Mouth/Throat:     Pharynx: Uvula midline. Posterior oropharyngeal erythema present.     Tonsils: Tonsillar exudate present. 2+ on the right. 2+ on the left.  Eyes:     Conjunctiva/sclera: Conjunctivae normal.  Cardiovascular:     Rate and Rhythm: Regular rhythm. Tachycardia present.     Pulses: Normal pulses.     Heart sounds: No murmur heard. Pulmonary:     Effort: Pulmonary effort is normal. No respiratory distress.     Breath sounds: Normal breath sounds.  Abdominal:     General: Abdomen is flat. There is no distension.     Palpations: Abdomen is soft.     Tenderness: There is no abdominal tenderness.  Musculoskeletal:        General: Normal range of motion.     Cervical back: Normal range of motion.  Lymphadenopathy:     Cervical: Cervical adenopathy present.  Skin:    General: Skin is warm and dry.     Capillary Refill: Capillary refill takes less than 2 seconds.  Neurological:     General: No focal deficit present.     Mental Status: She is alert.  Psychiatric:        Mood and Affect: Mood normal.     ED Results / Procedures / Treatments   Labs (all labs ordered are listed, but only abnormal results are displayed) Labs Reviewed  RESP PANEL BY RT-PCR (FLU A&B, COVID) [id number]  GROUP A STREP BY PCR    EKG None  Radiology No results found.  Procedures Procedures    Medications Ordered in ED Medications  lactated ringers bolus 1,000 mL (0 mLs Intravenous Stopped 01/31/22 1326)  ketorolac (TORADOL) 15 MG/ML injection 15 mg (15 mg Intravenous Given 01/31/22 1201)  ondansetron (ZOFRAN) injection 4 mg (4 mg Intravenous Given 01/31/22 1201)  dexamethasone (DECADRON) injection 10 mg (10 mg Intravenous Given 01/31/22 1201)    ED Course/ Medical Decision Making/ A&P                           Medical  Decision Making Risk Prescription drug management.   Social determinants of health:  Social History   Socioeconomic History   Marital status: Legally Separated    Spouse name: Not on file   Number of children: Not on file   Years of education: Not on file   Highest education level: Not on file  Occupational History   Not on  file  Tobacco Use   Smoking status: Former    Types: Cigarettes    Quit date: 07/06/2017    Years since quitting: 4.5   Smokeless tobacco: Never  Vaping Use   Vaping Use: Never used  Substance and Sexual Activity   Alcohol use: No   Drug use: No   Sexual activity: Yes    Birth control/protection: None  Other Topics Concern   Not on file  Social History Narrative   Not on file   Social Determinants of Health   Financial Resource Strain: Not on file  Food Insecurity: Not on file  Transportation Needs: Not on file  Physical Activity: Not on file  Stress: Not on file  Social Connections: Not on file  Intimate Partner Violence: Not on file     Initial impression:  This patient presents to the ED for concern of sore throat, this involves an extensive number of treatment options, and is a complaint that carries with it a high risk of complications and morbidity.   Differentials include strep pharyngitis, tonsillitis, viral URI, flu, COVID, peritonsillar abscess.   Comorbidities affecting care:  None  Additional history obtained: husband  Lab Tests  I Ordered, reviewed, and interpreted labs and EKG.  The pertinent results include:  Pending COVID, flu and strep.  Lab reportedly sent this over to [facility] lab as machine here in any pain is broken.   Cardiac Monitoring:  The patient was maintained on a cardiac monitor.  I personally viewed and interpreted the cardiac monitored which showed an underlying rhythm of: Sinus tachycardia   Medicines ordered and prescription drug management:  I ordered medication including: 1 L LR  bolus Decadron 10 mg IV Zofran 4 mg IV Toradol 15 mg IV Reevaluation of the patient after these medicines showed that the patient improved I have reviewed the patients home medicines and have made adjustments as needed   ED Course/Re-evaluation: Presents in no acute distress and is ill-appearing although nontoxic.  Vital significant for tachycardia from 110 to 119 bpm.  She is afebrile although temperature steadily increasing from 99.7 degrees to 100.1 F.  She is satting well on room air.  Slight muffling of voice.  Uvula is midline with bilateral tonsillar swelling and exudate.  Airway is intact.  Patient was given 1 L of fluids, Toradol and Zofran along with Decadron telegram IV for her tonsillar swelling. On reevaluation, patient was feeling only slightly improved.  After 2 hours of waiting for lab results, RN contacted lab who states that they had sent the strep and the respiratory panel over to [facility] lab as their machine is broken.  We were not notified.  Given this, I plan to discharge home patient with supportive care therapy pending results.  She does have MyChart and will continue to monitor for her results.  If strep returns positive, recommended that she call back to the emergency department or call the health department for prescription of antibiotics.  Patient expresses understanding is amenable to plan.  Strict return precautions were discussed.  Disposition:  After consideration of the diagnostic results, physical exam, history and the patients response to treatment feel that the patent would benefit from discharge.   Pharyngitis Tonsillitis: Plan and management as described above. Discharged home in good condition.  Final Clinical Impression(s) / ED Diagnoses Final diagnoses:  Pharyngitis, unspecified etiology  Tonsillitis    Rx / DC Orders ED Discharge Orders     None  Janell Quiet, [place] 01/31/22 1330    Eber Hong, MD 02/02/22 [phone]

## 2022-01-31 NOTE — Discharge Instructions (Signed)
Again you some steroids here in the emergency department which will help with the swelling of your tonsils and provide some comfort.  Continue taking Aleve and Motrin at home for your fevers and discomfort.  I would put a humidifier into your bed which will help keep the back of your throat moist and provide additional comfort at night.  Unfortunately, the machine that runs our strep test and our COVID and flu test has broken down and our lab has sent it over to the lab at [facility].  They will not return for several hours.  Since you have MyChart, you will be able to monitor when these results return.  If it returns positive for strep, please either call the emergency department back or contact your primary care at the health department to have antibiotics prescribed.  I apologize for this inconvenience.  I hope you feel better soon.

## 2022-02-01 ENCOUNTER — Telehealth (HOSPITAL_COMMUNITY): Payer: Self-pay | Admitting: Emergency Medicine

## 2022-02-01 MED ORDER — ALBUTEROL SULFATE HFA 108 (90 BASE) MCG/ACT IN AERS: 2.0000 | INHALATION_SPRAY | RESPIRATORY_TRACT | 3 refills | Status: AC | PRN

## 2022-02-01 MED ORDER — ONDANSETRON HCL 4 MG PO TABS: 4.0000 mg | ORAL_TABLET | Freq: Four times a day (QID) | ORAL | 0 refills | Status: AC

## 2022-02-01 NOTE — Telephone Encounter (Signed)
The patient's test came back for COVID, negative for strep, prescriptions for Zofran and albuterol called into patient's pharmacy

## 2022-11-20 ENCOUNTER — Other Ambulatory Visit (HOSPITAL_COMMUNITY): Payer: Self-pay | Admitting: Physician Assistant

## 2022-11-20 DIAGNOSIS — Z1231 Encounter for screening mammogram for malignant neoplasm of breast: Secondary | ICD-10-CM

## 2022-12-02 ENCOUNTER — Ambulatory Visit (HOSPITAL_COMMUNITY): Payer: Self-pay

## 2023-10-29 IMAGING — DX DG LUMBAR SPINE COMPLETE 4+V
5 series · 5 of 5 positions shown · non-contrast
Comparison: None.

CLINICAL DATA: Pain.

EXAM:
LUMBAR SPINE - COMPLETE 4+ VIEW

[l-spine ap]
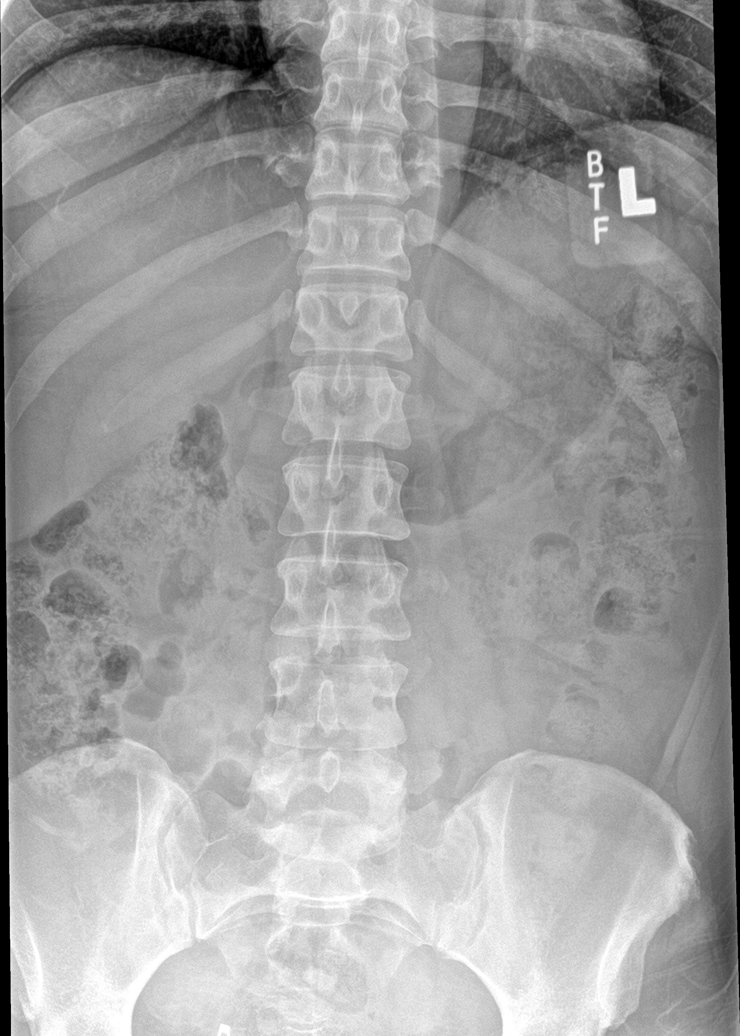

[l-spine obl (1 of 2)]
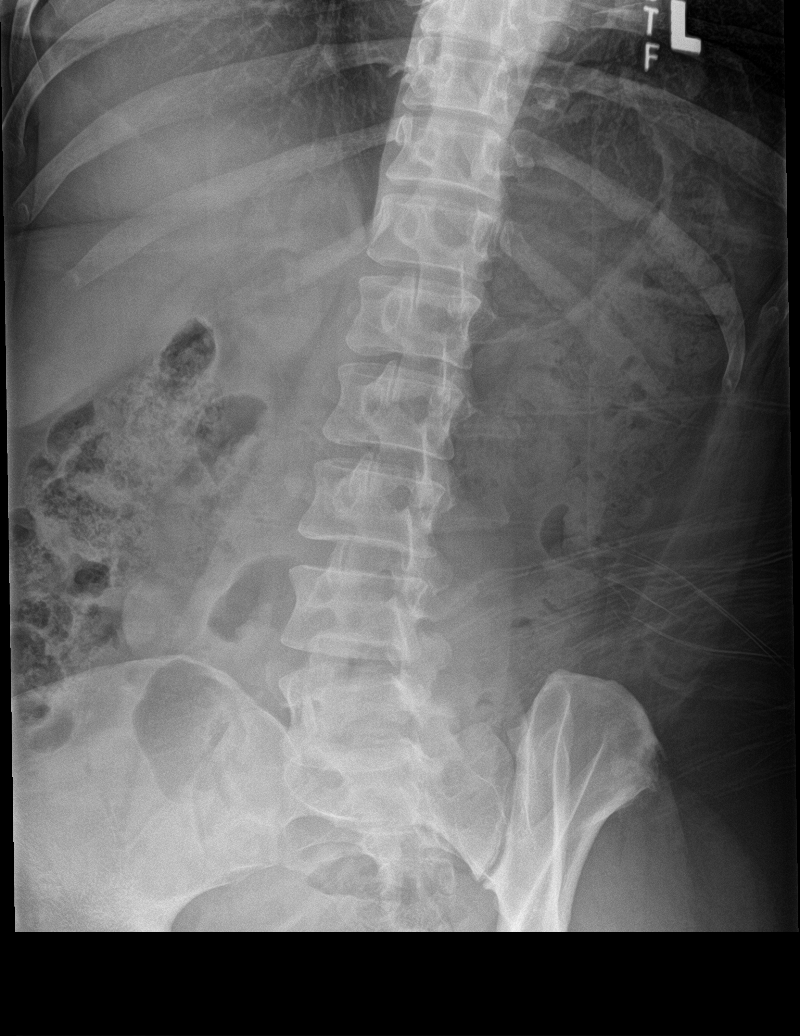

[l-spine obl (2 of 2)]
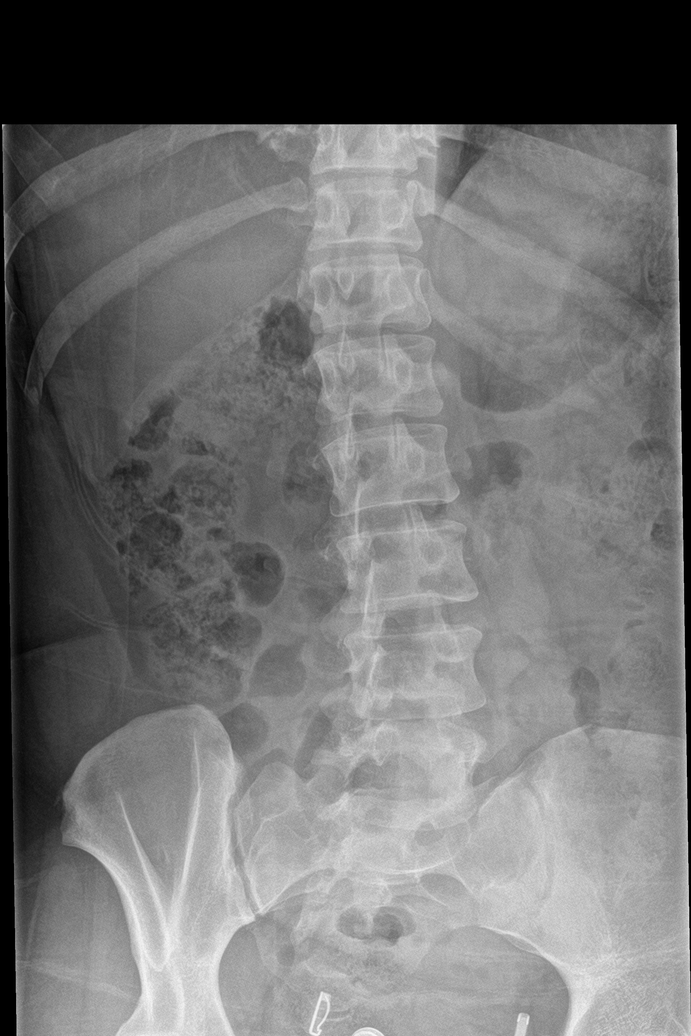

[l-spine lat]
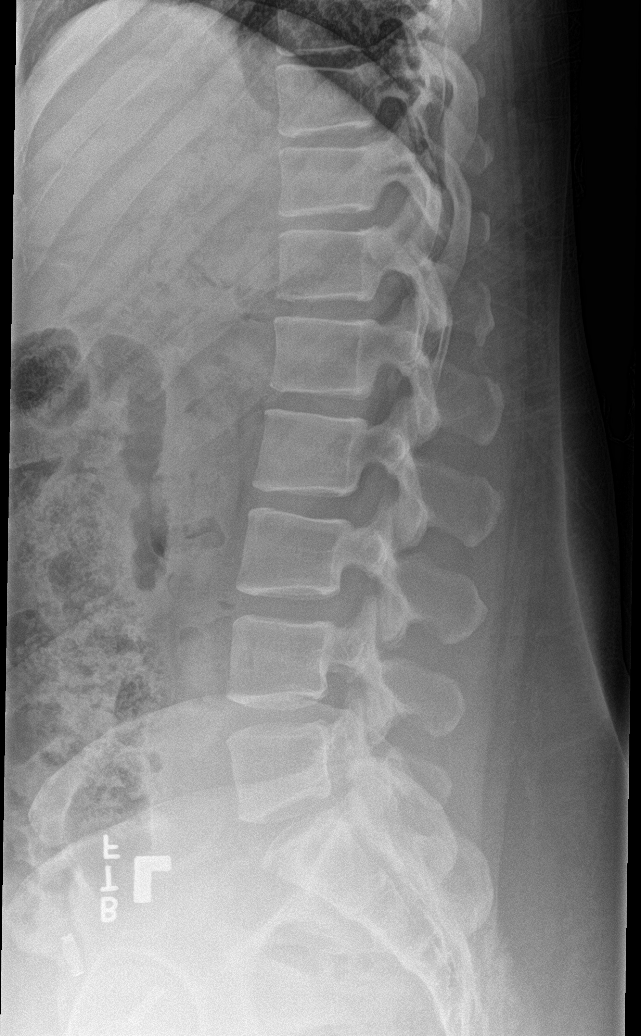

[l-spine spot]
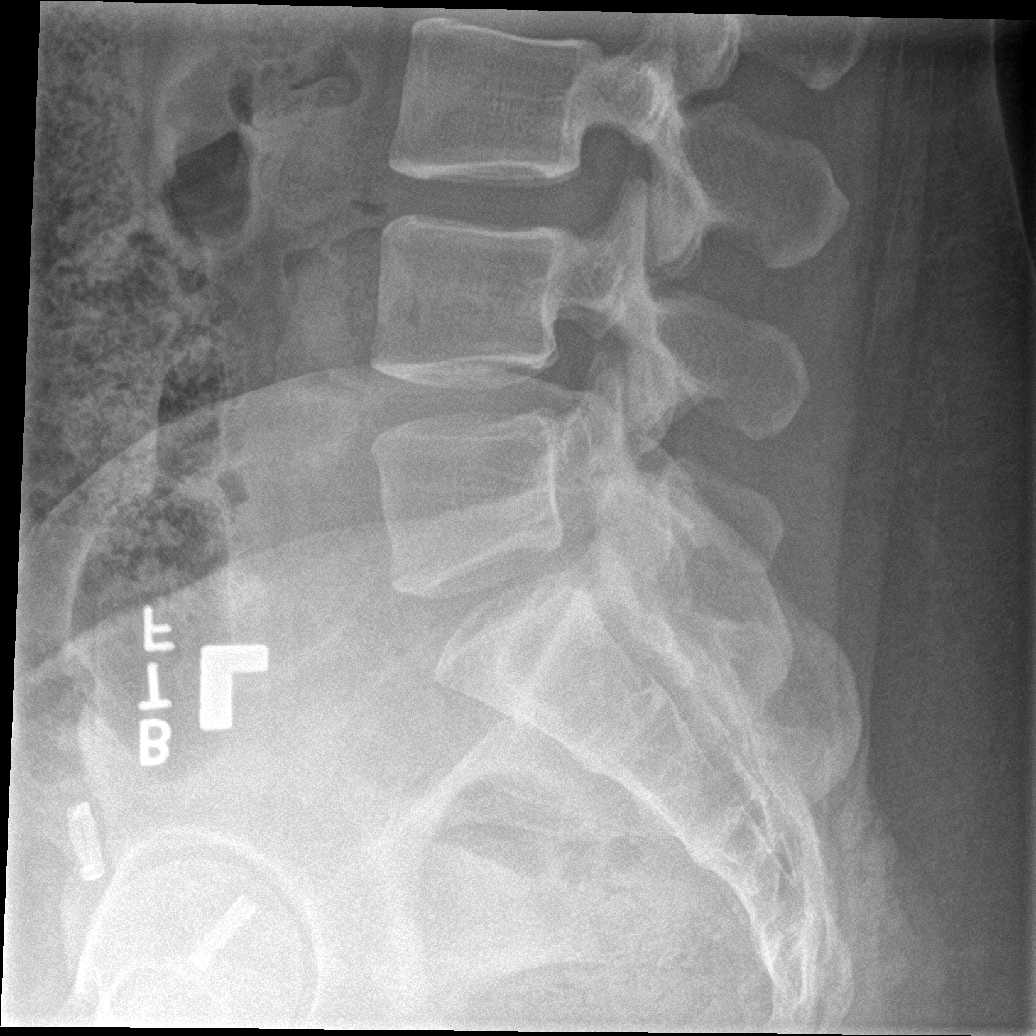

[5 of 5 positions shown; findings below may reference images not displayed]

FINDINGS: There is no evidence of lumbar spine fracture. Alignment is normal.
Intervertebral disc spaces are maintained. There are surgical clips
in the pelvis.
IMPRESSION: Negative.
